# Patient Record
Sex: Female | Born: 1975 | Race: Black or African American | Hispanic: No | Marital: Married | Smoking: Never smoker
Health system: Southern US, Community
[De-identification: ages and names within clinical notes are randomized; demographics above are authoritative.]

## PROBLEM LIST (undated history)

## (undated) DIAGNOSIS — A749 Chlamydial infection, unspecified: Secondary | ICD-10-CM

## (undated) DIAGNOSIS — R51 Headache: Secondary | ICD-10-CM

## (undated) DIAGNOSIS — A64 Unspecified sexually transmitted disease: Secondary | ICD-10-CM

## (undated) HISTORY — PX: TONSILLECTOMY: SUR1361

## (undated) HISTORY — PX: BREAST BIOPSY: SHX20

## (undated) HISTORY — DX: Unspecified sexually transmitted disease: A64

## (undated) HISTORY — PX: OTHER SURGICAL HISTORY: SHX169

## (undated) HISTORY — PX: TUBAL LIGATION: SHX77

---

## 2001-05-30 ENCOUNTER — Encounter: Payer: Self-pay | Admitting: Family Medicine

## 2001-05-30 ENCOUNTER — Ambulatory Visit (HOSPITAL_COMMUNITY): Admission: RE | Admit: 2001-05-30 | Discharge: 2001-05-30 | Payer: Self-pay | Admitting: *Deleted

## 2001-05-31 ENCOUNTER — Encounter: Payer: Self-pay | Admitting: Family Medicine

## 2001-05-31 ENCOUNTER — Inpatient Hospital Stay (HOSPITAL_COMMUNITY): Admission: AD | Admit: 2001-05-31 | Discharge: 2001-05-31 | Payer: Self-pay | Admitting: Family Medicine

## 2002-01-02 ENCOUNTER — Other Ambulatory Visit: Admission: RE | Admit: 2002-01-02 | Discharge: 2002-01-02 | Payer: Self-pay | Admitting: Family Medicine

## 2002-07-02 ENCOUNTER — Ambulatory Visit (HOSPITAL_COMMUNITY): Admission: RE | Admit: 2002-07-02 | Discharge: 2002-07-02 | Payer: Self-pay | Admitting: Orthopaedic Surgery

## 2002-07-02 ENCOUNTER — Encounter: Payer: Self-pay | Admitting: Orthopaedic Surgery

## 2002-12-16 ENCOUNTER — Other Ambulatory Visit: Admission: RE | Admit: 2002-12-16 | Discharge: 2002-12-16 | Payer: Self-pay | Admitting: Family Medicine

## 2003-03-03 ENCOUNTER — Emergency Department (HOSPITAL_COMMUNITY): Admission: EM | Admit: 2003-03-03 | Discharge: 2003-03-04 | Payer: Self-pay | Admitting: Emergency Medicine

## 2003-05-21 ENCOUNTER — Encounter: Payer: Self-pay | Admitting: Family Medicine

## 2003-05-21 ENCOUNTER — Ambulatory Visit (HOSPITAL_COMMUNITY): Admission: RE | Admit: 2003-05-21 | Discharge: 2003-05-21 | Payer: Self-pay | Admitting: Family Medicine

## 2004-11-30 ENCOUNTER — Emergency Department (HOSPITAL_COMMUNITY): Admission: EM | Admit: 2004-11-30 | Discharge: 2004-11-30 | Payer: Self-pay | Admitting: Emergency Medicine

## 2004-12-01 ENCOUNTER — Ambulatory Visit (HOSPITAL_COMMUNITY): Admission: RE | Admit: 2004-12-01 | Discharge: 2004-12-01 | Payer: Self-pay | Admitting: Emergency Medicine

## 2005-01-22 ENCOUNTER — Emergency Department (HOSPITAL_COMMUNITY): Admission: EM | Admit: 2005-01-22 | Discharge: 2005-01-22 | Payer: Self-pay | Admitting: Emergency Medicine

## 2005-02-12 ENCOUNTER — Emergency Department (HOSPITAL_COMMUNITY): Admission: EM | Admit: 2005-02-12 | Discharge: 2005-02-12 | Payer: Self-pay | Admitting: Emergency Medicine

## 2005-02-18 ENCOUNTER — Emergency Department (HOSPITAL_COMMUNITY): Admission: EM | Admit: 2005-02-18 | Discharge: 2005-02-18 | Payer: Self-pay | Admitting: Emergency Medicine

## 2005-02-22 ENCOUNTER — Inpatient Hospital Stay (HOSPITAL_COMMUNITY): Admission: AD | Admit: 2005-02-22 | Discharge: 2005-02-22 | Payer: Self-pay | Admitting: Obstetrics & Gynecology

## 2005-03-07 ENCOUNTER — Inpatient Hospital Stay (HOSPITAL_COMMUNITY): Admission: AD | Admit: 2005-03-07 | Discharge: 2005-03-07 | Payer: Self-pay | Admitting: Obstetrics and Gynecology

## 2005-05-09 ENCOUNTER — Ambulatory Visit (HOSPITAL_COMMUNITY): Admission: RE | Admit: 2005-05-09 | Discharge: 2005-05-09 | Payer: Self-pay | Admitting: Obstetrics & Gynecology

## 2005-06-16 ENCOUNTER — Emergency Department (HOSPITAL_COMMUNITY): Admission: EM | Admit: 2005-06-16 | Discharge: 2005-06-16 | Payer: Self-pay | Admitting: Emergency Medicine

## 2005-08-24 ENCOUNTER — Inpatient Hospital Stay (HOSPITAL_COMMUNITY): Admission: AD | Admit: 2005-08-24 | Discharge: 2005-08-25 | Payer: Self-pay | Admitting: Obstetrics

## 2005-08-28 ENCOUNTER — Inpatient Hospital Stay (HOSPITAL_COMMUNITY): Admission: AD | Admit: 2005-08-28 | Discharge: 2005-08-28 | Payer: Self-pay | Admitting: Obstetrics & Gynecology

## 2005-09-01 ENCOUNTER — Inpatient Hospital Stay (HOSPITAL_COMMUNITY): Admission: AD | Admit: 2005-09-01 | Discharge: 2005-09-01 | Payer: Self-pay | Admitting: Obstetrics & Gynecology

## 2005-09-09 ENCOUNTER — Inpatient Hospital Stay (HOSPITAL_COMMUNITY): Admission: AD | Admit: 2005-09-09 | Discharge: 2005-09-10 | Payer: Self-pay | Admitting: Obstetrics

## 2005-09-10 ENCOUNTER — Inpatient Hospital Stay (HOSPITAL_COMMUNITY): Admission: AD | Admit: 2005-09-10 | Discharge: 2005-09-10 | Payer: Self-pay | Admitting: Obstetrics

## 2005-09-24 ENCOUNTER — Inpatient Hospital Stay (HOSPITAL_COMMUNITY): Admission: AD | Admit: 2005-09-24 | Discharge: 2005-09-25 | Payer: Self-pay | Admitting: Obstetrics

## 2005-10-04 ENCOUNTER — Inpatient Hospital Stay (HOSPITAL_COMMUNITY): Admission: AD | Admit: 2005-10-04 | Discharge: 2005-10-07 | Payer: Self-pay | Admitting: Obstetrics & Gynecology

## 2005-10-06 ENCOUNTER — Encounter (INDEPENDENT_AMBULATORY_CARE_PROVIDER_SITE_OTHER): Payer: Self-pay | Admitting: Specialist

## 2006-02-28 ENCOUNTER — Emergency Department (HOSPITAL_COMMUNITY): Admission: EM | Admit: 2006-02-28 | Discharge: 2006-02-28 | Payer: Self-pay | Admitting: Emergency Medicine

## 2006-09-12 ENCOUNTER — Emergency Department (HOSPITAL_COMMUNITY): Admission: EM | Admit: 2006-09-12 | Discharge: 2006-09-12 | Payer: Self-pay | Admitting: Emergency Medicine

## 2007-02-02 ENCOUNTER — Inpatient Hospital Stay (HOSPITAL_COMMUNITY): Admission: AD | Admit: 2007-02-02 | Discharge: 2007-02-02 | Payer: Self-pay | Admitting: Obstetrics & Gynecology

## 2008-01-29 ENCOUNTER — Emergency Department (HOSPITAL_COMMUNITY): Admission: EM | Admit: 2008-01-29 | Discharge: 2008-01-29 | Payer: Self-pay | Admitting: Family Medicine

## 2009-04-08 ENCOUNTER — Emergency Department (HOSPITAL_COMMUNITY): Admission: EM | Admit: 2009-04-08 | Discharge: 2009-04-08 | Payer: Self-pay | Admitting: Emergency Medicine

## 2009-05-05 ENCOUNTER — Emergency Department (HOSPITAL_COMMUNITY): Admission: EM | Admit: 2009-05-05 | Discharge: 2009-05-05 | Payer: Self-pay | Admitting: Emergency Medicine

## 2009-10-11 ENCOUNTER — Emergency Department (HOSPITAL_COMMUNITY): Admission: EM | Admit: 2009-10-11 | Discharge: 2009-10-11 | Payer: Self-pay | Admitting: Emergency Medicine

## 2009-10-15 ENCOUNTER — Emergency Department (HOSPITAL_COMMUNITY): Admission: EM | Admit: 2009-10-15 | Discharge: 2009-10-15 | Payer: Self-pay | Admitting: Family Medicine

## 2010-01-19 ENCOUNTER — Emergency Department (HOSPITAL_COMMUNITY): Admission: EM | Admit: 2010-01-19 | Discharge: 2010-01-19 | Payer: Self-pay | Admitting: Emergency Medicine

## 2010-01-27 ENCOUNTER — Emergency Department (HOSPITAL_COMMUNITY): Admission: EM | Admit: 2010-01-27 | Discharge: 2010-01-27 | Payer: Self-pay | Admitting: Emergency Medicine

## 2010-03-04 ENCOUNTER — Emergency Department (HOSPITAL_COMMUNITY): Admission: EM | Admit: 2010-03-04 | Discharge: 2010-03-04 | Payer: Self-pay | Admitting: Emergency Medicine

## 2010-03-17 ENCOUNTER — Emergency Department (HOSPITAL_COMMUNITY): Admission: EM | Admit: 2010-03-17 | Discharge: 2010-03-17 | Payer: Self-pay | Admitting: Family Medicine

## 2010-03-23 ENCOUNTER — Emergency Department (HOSPITAL_COMMUNITY): Admission: EM | Admit: 2010-03-23 | Discharge: 2010-03-23 | Payer: Self-pay | Admitting: Family Medicine

## 2010-07-04 ENCOUNTER — Emergency Department (HOSPITAL_COMMUNITY): Admission: EM | Admit: 2010-07-04 | Discharge: 2010-07-04 | Payer: Self-pay | Admitting: Family Medicine

## 2010-08-05 ENCOUNTER — Inpatient Hospital Stay (HOSPITAL_COMMUNITY)
Admission: AD | Admit: 2010-08-05 | Discharge: 2010-08-05 | Payer: Self-pay | Source: Home / Self Care | Attending: Obstetrics & Gynecology | Admitting: Obstetrics & Gynecology

## 2010-08-23 ENCOUNTER — Ambulatory Visit: Admit: 2010-08-23 | Payer: Self-pay | Admitting: Obstetrics and Gynecology

## 2010-09-13 ENCOUNTER — Emergency Department (HOSPITAL_COMMUNITY)
Admission: EM | Admit: 2010-09-13 | Discharge: 2010-09-13 | Payer: BC Managed Care – PPO | Source: Home / Self Care | Admitting: Family Medicine

## 2010-10-23 ENCOUNTER — Inpatient Hospital Stay (HOSPITAL_COMMUNITY)
Admission: AD | Admit: 2010-10-23 | Discharge: 2010-10-23 | Disposition: A | Discharge status: Home / Self Care | Payer: BC Managed Care – PPO | Source: Ambulatory Visit | Attending: Obstetrics & Gynecology | Admitting: Obstetrics & Gynecology

## 2010-10-23 DIAGNOSIS — B356 Tinea cruris: Secondary | ICD-10-CM | POA: Insufficient documentation

## 2010-10-27 ENCOUNTER — Emergency Department (HOSPITAL_COMMUNITY)
Admission: EM | Admit: 2010-10-27 | Discharge: 2010-10-27 | Disposition: A | Discharge status: Home / Self Care | Payer: BC Managed Care – PPO | Attending: Emergency Medicine | Admitting: Emergency Medicine

## 2010-10-27 DIAGNOSIS — L0231 Cutaneous abscess of buttock: Secondary | ICD-10-CM | POA: Insufficient documentation

## 2010-10-27 DIAGNOSIS — L03317 Cellulitis of buttock: Secondary | ICD-10-CM | POA: Insufficient documentation

## 2010-10-30 LAB — POCT PREGNANCY, URINE: Preg Test, Ur: NEGATIVE

## 2010-10-30 LAB — GC/CHLAMYDIA PROBE AMP, GENITAL
Chlamydia, DNA Probe: NEGATIVE
GC Probe Amp, Genital: NEGATIVE

## 2010-10-30 LAB — CBC
HCT: 37 % (ref 36.0–46.0)
WBC: 3.5 10*3/uL — ABNORMAL LOW (ref 4.0–10.5)

## 2010-10-30 LAB — WET PREP, GENITAL
Clue Cells Wet Prep HPF POC: NONE SEEN
Yeast Wet Prep HPF POC: NONE SEEN

## 2010-11-06 LAB — POCT I-STAT, CHEM 8
BUN: 14 mg/dL (ref 6–23)
HCT: 44 % (ref 36.0–46.0)

## 2010-11-06 LAB — DIFFERENTIAL
Basophils Absolute: 0 10*3/uL (ref 0.0–0.1)
Eosinophils Absolute: 0.2 10*3/uL (ref 0.0–0.7)
Lymphocytes Relative: 29 % (ref 12–46)
Monocytes Absolute: 0.5 10*3/uL (ref 0.1–1.0)
Monocytes Relative: 7 % (ref 3–12)
Neutro Abs: 4 10*3/uL (ref 1.7–7.7)
Neutrophils Relative %: 61 % (ref 43–77)

## 2010-11-06 LAB — CBC
MCHC: 34.7 g/dL (ref 30.0–36.0)
RDW: 12.8 % (ref 11.5–15.5)
WBC: 6.5 10*3/uL (ref 4.0–10.5)

## 2010-11-06 LAB — POCT PREGNANCY, URINE: Preg Test, Ur: NEGATIVE

## 2010-11-12 ENCOUNTER — Emergency Department (HOSPITAL_COMMUNITY)
Admission: EM | Admit: 2010-11-12 | Discharge: 2010-11-12 | Disposition: A | Discharge status: Home / Self Care | Payer: BC Managed Care – PPO | Attending: Emergency Medicine | Admitting: Emergency Medicine

## 2010-11-12 DIAGNOSIS — L0231 Cutaneous abscess of buttock: Secondary | ICD-10-CM | POA: Insufficient documentation

## 2010-11-12 DIAGNOSIS — L03317 Cellulitis of buttock: Secondary | ICD-10-CM | POA: Insufficient documentation

## 2010-11-12 DIAGNOSIS — R609 Edema, unspecified: Secondary | ICD-10-CM | POA: Insufficient documentation

## 2010-11-14 ENCOUNTER — Inpatient Hospital Stay (INDEPENDENT_AMBULATORY_CARE_PROVIDER_SITE_OTHER)
Admission: RE | Admit: 2010-11-14 | Discharge: 2010-11-14 | Disposition: A | Discharge status: Home / Self Care | Payer: BC Managed Care – PPO | Source: Ambulatory Visit | Attending: Emergency Medicine | Admitting: Emergency Medicine

## 2010-11-14 DIAGNOSIS — L0291 Cutaneous abscess, unspecified: Secondary | ICD-10-CM

## 2010-11-14 DIAGNOSIS — L0390 Cellulitis, unspecified: Secondary | ICD-10-CM

## 2010-12-15 ENCOUNTER — Emergency Department (HOSPITAL_COMMUNITY)
Admission: EM | Admit: 2010-12-15 | Discharge: 2010-12-15 | Disposition: A | Discharge status: Home / Self Care | Payer: BC Managed Care – PPO | Attending: Emergency Medicine | Admitting: Emergency Medicine

## 2010-12-15 DIAGNOSIS — L03317 Cellulitis of buttock: Secondary | ICD-10-CM | POA: Insufficient documentation

## 2010-12-15 DIAGNOSIS — L0231 Cutaneous abscess of buttock: Secondary | ICD-10-CM | POA: Insufficient documentation

## 2010-12-17 ENCOUNTER — Emergency Department (HOSPITAL_COMMUNITY)
Admission: EM | Admit: 2010-12-17 | Discharge: 2010-12-17 | Disposition: A | Discharge status: Home / Self Care | Payer: BC Managed Care – PPO | Attending: Emergency Medicine | Admitting: Emergency Medicine

## 2010-12-17 DIAGNOSIS — L0231 Cutaneous abscess of buttock: Secondary | ICD-10-CM | POA: Insufficient documentation

## 2010-12-17 DIAGNOSIS — L03317 Cellulitis of buttock: Secondary | ICD-10-CM | POA: Insufficient documentation

## 2010-12-17 LAB — CBC
HCT: 38.5 % (ref 36.0–46.0)
Hemoglobin: 12.9 g/dL (ref 12.0–15.0)
MCH: 29.6 pg (ref 26.0–34.0)
MCHC: 33.5 g/dL (ref 30.0–36.0)
MCV: 88.3 fL (ref 78.0–100.0)
RDW: 13.4 % (ref 11.5–15.5)
WBC: 6.2 10*3/uL (ref 4.0–10.5)

## 2010-12-17 LAB — DIFFERENTIAL
Basophils Absolute: 0 10*3/uL (ref 0.0–0.1)
Basophils Relative: 0 % (ref 0–1)
Lymphocytes Relative: 26 % (ref 12–46)
Monocytes Absolute: 0.5 10*3/uL (ref 0.1–1.0)
Neutrophils Relative %: 62 % (ref 43–77)

## 2010-12-17 LAB — POCT I-STAT, CHEM 8
HCT: 40 % (ref 36.0–46.0)
Hemoglobin: 13.6 g/dL (ref 12.0–15.0)
Potassium: 3.7 mEq/L (ref 3.5–5.1)
Sodium: 142 mEq/L (ref 135–145)
TCO2: 28 mmol/L (ref 0–100)

## 2011-01-05 NOTE — Discharge Summary (Signed)
Jasmine Gentry, Jasmine Gentry                ACCOUNT NO.:  [id number]   MEDICAL RECORD NO.:  [id number]          PATIENT TYPE:  INP   LOCATION:  9106                          [facility]  [facility]   PHYSICIAN:  Charles A. Clearance Coots, M.D.DATE OF BIRTH:  1976/03/30   DATE OF ADMISSION:  10/04/2005  DATE OF DISCHARGE:  10/07/2005                                 DISCHARGE SUMMARY   ADMITTING DIAGNOSES:  1.  Forty weeks gestation.  2.  Induction of labor for post dates.  3.  Desire sterilization.   DISCHARGE DIAGNOSES:  1.  Forty weeks gestation.  2.  Induction of labor for post dates.  3.  Desire sterilization status post normal spontaneous vaginal delivery of      a viable female infant on October 05, 2005 at 2136.  Apgars of 8 at one      minute and 9 at five minutes, weight of 4315 g and length of 51 cm.      Mother and infant discharged home in good condition.   REASON FOR ADMISSION:  A 35 year old black female, G4, P2, estimated date of  confinement of October 02, 2005 presented for induction of labor for post  dates.  Prenatal care was uncomplicated.  Group B strep was positive.   PAST MEDICAL HISTORY:  SURGERY:  Tonsillectomy and wisdom teeth.  ILLNESS:  None.   MEDICATIONS:  Prenatal vitamins.   ALLERGIES:  NO KNOWN DRUG ALLERGIES.   SOCIAL HISTORY:  Divorced, negative tobacco, alcohol or recreational drug  use.   PHYSICAL EXAMINATION:  VITAL SIGNS:  Afebrile and vital signs are stable.  GENERAL:  Well-nourished, well-developed female in no acute distress.  ABDOMEN:  Gravid, nontender.  CERVIX:  3 cm dilated at 50% effaced and vertex at a -3 station.   ADMITTING LABORATORY VALUES:  Hemoglobin 11, hematocrit 32.5, white blood  cell count 8,400 and platelets 295,000.   HOSPITAL COURSE:  The patient was started on Pitocin induction of labor and  made good progress in labor, and delivered a viable female infant without  complications.  The patient desired permanent sterilization and was  taken to  the operating room on postpartum day #1 for a tubal ligation procedure.  Postpartum tubal ligation was done without complications.  The remainder of  the postpartum and postoperative course was uncomplicated, and the patient  was discharged home on postpartum day #2 in good condition.   DISCHARGE LABORATORY VALUES:  Hemoglobin 9.9 and hematocrit 29.7.   DISCHARGE DISPOSITION:  MEDICATION:  1.  Darvocet N-100 and ibuprofen was prescribed for pain.  2.  Continue prenatal vitamins.   Routine written instructions were given for obstetrical discharge.  The  patient is to call the office for a follow-up appointment in two weeks.      Charles A. Clearance Coots, M.D.  Electronically Signed     CAH/MEDQ  D:  10/07/2005  T:  10/07/2005  Job:  [id number]

## 2011-01-05 NOTE — Op Note (Signed)
[name], Jasmine Gentry                ACCOUNT NO.:  [id number]   MEDICAL RECORD NO.:  [id number]          PATIENT TYPE:  INP   LOCATION:  9106                          [facility]   PHYSICIAN:  Charles A. Clearance Coots, M.D.DATE OF BIRTH:  05-08-1976   DATE OF PROCEDURE:  10/06/2005  DATE OF DISCHARGE:                                 OPERATIVE REPORT   PREOPERATIVE DIAGNOSIS:  Desires sterilization.   POSTOPERATIVE DIAGNOSIS:  Desires sterilization.   PROCEDURE:  Bilateral partial salpingectomy.   SURGEON:  Coral Ceo, MD   ANESTHESIA:  Epidural.   ESTIMATED BLOOD LOSS:  Negligible.   COMPLICATIONS:  None.   SPECIMEN:  Approximately 2 cm portions of left and right fallopian tubes.   OPERATION:  The patient was brought to the operating room and after  satisfactory redosing of the epidural, the abdomen was prepped and draped in  the usual sterile fashion.  A small inferior umbilical incision was made  with a scalpel that was deepened down to the fascia with curved Mayo  scissors.  The fascia was grasped in the midline with Kelly forceps and cut  transversely with curved Mayo scissors.  The fascial incision was extended  to the left and to the right with the curved Mayo scissors.  The peritoneum  was also incised with the curved Mayo scissors at the same time as the  fascial incision.  Right-angle retractors were placed in the incision, and  the right fallopian tube was identified and was grasped with a Babcock  clamp.  Tube was followed from the corneal end to the fimbrial end and then  grasped with Babcock clamps, and then the tube was followed retrograde back  to the isthmic area of the tube, and this area was grasped with the Babcock  clamp.  A knuckle of tube beneath the Babcock clamp was doubly ligated with  #1 plain catgut, and the section of tube above the knot was excised with  Metzenbaum scissors and submitted to pathology for evaluation.  There was no  active bleeding  from the tubal stumps.  The same procedure was performed on  the opposite side without complications.  The abdomen was then closed as  follows.  The peritoneum and fascia was closed as one with a continuous  suture of 2-0 Vicryl; the skin was closed with a continuous subcuticular  suture of 3-0 Monocryl.  Sterile bandage was applied to the incision  closure.  Surgical technician indicated that all needle, sponge, and  instrument counts were correct.  The patient tolerated the procedure well  and was transported to the recovery room in satisfactory condition.     Charles A. Clearance Coots, M.D.  Electronically Signed    CAH/MEDQ  D:  10/06/2005  T:  10/06/2005  Job:  [id number]

## 2011-01-07 ENCOUNTER — Emergency Department (HOSPITAL_COMMUNITY)
Admission: EM | Admit: 2011-01-07 | Discharge: 2011-01-07 | Disposition: A | Discharge status: Home / Self Care | Payer: BC Managed Care – PPO | Attending: Emergency Medicine | Admitting: Emergency Medicine

## 2011-01-07 DIAGNOSIS — R51 Headache: Secondary | ICD-10-CM | POA: Insufficient documentation

## 2011-01-07 DIAGNOSIS — H53149 Visual discomfort, unspecified: Secondary | ICD-10-CM | POA: Insufficient documentation

## 2011-01-07 DIAGNOSIS — R112 Nausea with vomiting, unspecified: Secondary | ICD-10-CM | POA: Insufficient documentation

## 2011-04-18 ENCOUNTER — Emergency Department (HOSPITAL_COMMUNITY)
Admission: EM | Admit: 2011-04-18 | Discharge: 2011-04-18 | Disposition: A | Discharge status: Home / Self Care | Payer: BC Managed Care – PPO | Attending: Emergency Medicine | Admitting: Emergency Medicine

## 2011-04-18 DIAGNOSIS — R110 Nausea: Secondary | ICD-10-CM | POA: Insufficient documentation

## 2011-04-18 DIAGNOSIS — R51 Headache: Secondary | ICD-10-CM | POA: Insufficient documentation

## 2011-04-21 ENCOUNTER — Emergency Department (HOSPITAL_COMMUNITY)
Admission: EM | Admit: 2011-04-21 | Discharge: 2011-04-21 | Disposition: A | Discharge status: Home / Self Care | Payer: BC Managed Care – PPO | Attending: Emergency Medicine | Admitting: Emergency Medicine

## 2011-04-21 DIAGNOSIS — R42 Dizziness and giddiness: Secondary | ICD-10-CM | POA: Insufficient documentation

## 2011-04-21 DIAGNOSIS — H53149 Visual discomfort, unspecified: Secondary | ICD-10-CM | POA: Insufficient documentation

## 2011-04-21 DIAGNOSIS — H538 Other visual disturbances: Secondary | ICD-10-CM | POA: Insufficient documentation

## 2011-04-21 DIAGNOSIS — G43909 Migraine, unspecified, not intractable, without status migrainosus: Secondary | ICD-10-CM | POA: Insufficient documentation

## 2011-04-21 DIAGNOSIS — R110 Nausea: Secondary | ICD-10-CM | POA: Insufficient documentation

## 2011-05-22 ENCOUNTER — Encounter (HOSPITAL_COMMUNITY): Payer: Self-pay | Admitting: *Deleted

## 2011-05-22 ENCOUNTER — Inpatient Hospital Stay (HOSPITAL_COMMUNITY): Payer: BC Managed Care – PPO

## 2011-05-22 ENCOUNTER — Inpatient Hospital Stay (HOSPITAL_COMMUNITY)
Admission: AD | Admit: 2011-05-22 | Discharge: 2011-05-22 | Disposition: A | Discharge status: Home / Self Care | Payer: BC Managed Care – PPO | Source: Ambulatory Visit | Attending: Obstetrics & Gynecology | Admitting: Obstetrics & Gynecology

## 2011-05-22 DIAGNOSIS — R109 Unspecified abdominal pain: Secondary | ICD-10-CM | POA: Insufficient documentation

## 2011-05-22 DIAGNOSIS — N949 Unspecified condition associated with female genital organs and menstrual cycle: Secondary | ICD-10-CM

## 2011-05-22 DIAGNOSIS — N938 Other specified abnormal uterine and vaginal bleeding: Secondary | ICD-10-CM

## 2011-05-22 HISTORY — DX: Chlamydial infection, unspecified: A74.9

## 2011-05-22 LAB — CBC
HCT: 37.7 % (ref 36.0–46.0)
MCHC: 33.7 g/dL (ref 30.0–36.0)
MCV: 88.7 fL (ref 78.0–100.0)
Platelets: 315 10*3/uL (ref 150–400)
RDW: 12.9 % (ref 11.5–15.5)
WBC: 5.9 10*3/uL (ref 4.0–10.5)

## 2011-05-22 LAB — URINE MICROSCOPIC-ADD ON

## 2011-05-22 LAB — URINALYSIS, ROUTINE W REFLEX MICROSCOPIC
Ketones, ur: NEGATIVE mg/dL
Protein, ur: NEGATIVE mg/dL
Urobilinogen, UA: 0.2 mg/dL (ref 0.0–1.0)

## 2011-05-22 LAB — WET PREP, GENITAL
Clue Cells Wet Prep HPF POC: NONE SEEN
Trich, Wet Prep: NONE SEEN

## 2011-05-22 MED ORDER — MEDROXYPROGESTERONE ACETATE 10 MG PO TABS: 10.0000 mg | ORAL_TABLET | Freq: Every day | ORAL | Status: DC

## 2011-05-22 MED ORDER — NAPROXEN SODIUM 550 MG PO TABS: 550.0000 mg | ORAL_TABLET | Freq: Two times a day (BID) | ORAL | Status: DC

## 2011-05-22 NOTE — ED Provider Notes (Signed)
History   Pt presents today c/o vag bleeding and lower abd cramping. She states she began her menses about 2wks ago and has continued to bleed every day since that time. She states the bleeding became very heavy last night and she had to change pads about every . She denies fever, vag irritation, or any other problems at this time. Her last episode of intercourse was about 3wks ago.  Chief Complaint  Patient presents with  . Vaginal Bleeding   HPI  OB History    Grav Para Term Preterm Abortions TAB SAB Ect Mult Living   4 3 3  0 1 0 1 0 0 3      Past Medical History  Diagnosis Date  . Chlamydia     Past Surgical History  Procedure Date  . Tubal ligation   . Tonsillectomy     No family history on file.  History  Substance Use Topics  . Smoking status: Never Smoker   . Smokeless tobacco: Never Used  . Alcohol Use: No    Allergies: Allergies not on file  No prescriptions prior to admission    Review of Systems  Constitutional: Negative for fever.  Cardiovascular: Negative for chest pain.  Gastrointestinal: Positive for abdominal pain. Negative for nausea, vomiting, diarrhea and constipation.  Genitourinary: Negative for dysuria, urgency, frequency and hematuria.  Neurological: Negative for dizziness and headaches.  Psychiatric/Behavioral: Negative for depression and suicidal ideas.   Physical Exam   Blood pressure 131/82, pulse 90, temperature 98.1 F (36.7 C), temperature source Oral, resp. rate 18, height 5' 7.5" (1.715 m), weight 205 lb (92.987 kg), last menstrual period 05/07/2011.  Physical Exam  Constitutional: She is oriented to person, place, and time. She appears well-developed and well-nourished. No distress.  HENT:  Head: Normocephalic and atraumatic.  Eyes: EOM are normal. Pupils are equal, round, and reactive to light.  GI: Soft. She exhibits no distension and no mass. There is tenderness. There is no rebound and no guarding.  Genitourinary:  There is bleeding around the vagina. No vaginal discharge found.       Cervix Lg/closed. Uterus NL size and shape. No adnexal masses. Pt slightly tender on exam.  Neurological: She is alert and oriented to person, place, and time.  Skin: Skin is warm and dry. She is not diaphoretic.  Psychiatric: She has a normal mood and affect. Her behavior is normal. Judgment and thought content normal.    MAU Course  Procedures  Wet prep and GC/Chlamydia cultures done.  Results for orders placed during the hospital encounter of 05/22/11 (from the past 24 hour(s))  URINALYSIS, ROUTINE W REFLEX MICROSCOPIC     Status: Abnormal   Collection Time   05/22/11  7:55 AM      Component Value Range   Color, Urine BROWN (*) YELLOW    Appearance CLOUDY (*) CLEAR    Specific Gravity, Urine >1.030 (*) 1.005 - 1.030    pH 5.0  5.0 - 8.0    Glucose, UA NEGATIVE  NEGATIVE (mg/dL)   Hgb urine dipstick LARGE (*) NEGATIVE    Bilirubin Urine NEGATIVE  NEGATIVE    Ketones, ur NEGATIVE  NEGATIVE (mg/dL)   Protein, ur NEGATIVE  NEGATIVE (mg/dL)   Urobilinogen, UA 0.2  0.0 - 1.0 (mg/dL)   Nitrite NEGATIVE  NEGATIVE    Leukocytes, UA TRACE (*) NEGATIVE   URINE MICROSCOPIC-ADD ON     Status: Normal   Collection Time   05/22/11  7:55 AM  Component Value Range   Squamous Epithelial / LPF RARE  RARE    WBC, UA 3-6  <3 (WBC/hpf)   RBC / HPF TOO NUMEROUS TO COUNT  <3 (RBC/hpf)   Bacteria, UA RARE  RARE   POCT PREGNANCY, URINE     Status: Normal   Collection Time   05/22/11  8:05 AM      Component Value Range   Preg Test, Ur NEGATIVE    WET PREP, GENITAL     Status: Abnormal   Collection Time   05/22/11  8:18 AM      Component Value Range   Yeast, Wet Prep NONE SEEN  NONE SEEN    Trich, Wet Prep NONE SEEN  NONE SEEN    Clue Cells, Wet Prep NONE SEEN  NONE SEEN    WBC, Wet Prep HPF POC FEW (*) NONE SEEN   CBC     Status: Normal   Collection Time   05/22/11  8:23 AM      Component Value Range   WBC 5.9  4.0 -  10.5 (K/uL)   RBC 4.25  3.87 - 5.11 (MIL/uL)   Hemoglobin 12.7  12.0 - 15.0 (g/dL)   HCT 21.3  08.6 - 57.8 (%)   MCV 88.7  78.0 - 100.0 (fL)   MCH 29.9  26.0 - 34.0 (pg)   MCHC 33.7  30.0 - 36.0 (g/dL)   RDW 46.9  62.9 - 52.8 (%)   Platelets 315  150 - 400 (K/uL)    US Transvaginal Non-ob  05/22/2011  *RADIOLOGY REPORT*  Clinical Data: Pain  TRANSABDOMINAL AND TRANSVAGINAL ULTRASOUND OF PELVIS Technique:  Both transabdominal and transvaginal ultrasound examinations of the pelvis were performed. Transabdominal technique was performed for global imaging of the pelvis including uterus, ovaries, adnexal regions, and pelvic cul-de-sac.  Comparison: August 05, 2010   It was necessary to proceed with endovaginal exam following the transabdominal exam to visualize the endometrium.  Findings: The uterus is normal in size, measuring 7.6 x 5.0 x 4.7 cm.  The myometrial echotexture is heterogeneous and the junctional zone is indistinct which suggests an element of adenomyosis. There are no focal fibroids.  The endometrial stripe is upper  limits of normal in width, measuring 13 mm.  Both ovaries have a normal size and appearance.  The right ovary measures 3.8 x 1.9 x 2.6 cm, and the left ovary measures 3.8 x 2.8 x 2.5 cm.  There are no adnexal masses or free pelvic fluid.  IMPRESSION: Probable adenomyosis with heterogeneous myometrial echotexture and indistinct junctional zone.  Original Report Authenticated By: Brandon Melnick, M.D.   US Pelvis Complete  05/22/2011  *RADIOLOGY REPORT*  Clinical Data: Pain  TRANSABDOMINAL AND TRANSVAGINAL ULTRASOUND OF PELVIS Technique:  Both transabdominal and transvaginal ultrasound examinations of the pelvis were performed. Transabdominal technique was performed for global imaging of the pelvis including uterus, ovaries, adnexal regions, and pelvic cul-de-sac.  Comparison: August 05, 2010   It was necessary to proceed with endovaginal exam following the transabdominal exam  to visualize the endometrium.  Findings: The uterus is normal in size, measuring 7.6 x 5.0 x 4.7 cm.  The myometrial echotexture is heterogeneous and the junctional zone is indistinct which suggests an element of adenomyosis. There are no focal fibroids.  The endometrial stripe is upper  limits of normal in width, measuring 13 mm.  Both ovaries have a normal size and appearance.  The right ovary measures 3.8 x 1.9 x 2.6 cm, and  the left ovary measures 3.8 x 2.8 x 2.5 cm.  There are no adnexal masses or free pelvic fluid.  IMPRESSION: Probable adenomyosis with heterogeneous myometrial echotexture and indistinct junctional zone.  Original Report Authenticated By: Brandon Melnick, M.D.    Assessment and Plan  Abnormal vag bleeding: discussed with pt at length. Will give Rx for provera and anaprox DS. She will f/u in the GYN clinic. Discussed diet, activity, risks, and precautions.  Clinton Gallant. Rice III, DrHSc, MPAS, PA-C  05/22/2011, 8:18 AM   Henrietta Hoover, PA 05/22/11 0945

## 2011-05-22 NOTE — ED Provider Notes (Signed)
Attestation of Attending Supervision of Advanced Practitioner: Evaluation and management procedures were performed by the PA/NP/CNM/OB Fellow under my supervision/collaboration. Chart reviewed and agree with management and plan.  ANYANWU,UGONNA A 05/22/2011 10:44 AM   

## 2011-05-22 NOTE — Progress Notes (Signed)
LMP 2 weeks ago, continued for 2 weeks, stopped for 2 days then started again. Last night was changing pads every 30 minutes, passing clots. Cramping. States has had abnormal bleeding in the past, but has never been evaluated. States has not had a PAP smear in at least 5 years.

## 2011-05-23 LAB — URINE CULTURE: Colony Count: NO GROWTH

## 2011-05-23 LAB — GC/CHLAMYDIA PROBE AMP, GENITAL: Chlamydia, DNA Probe: NEGATIVE

## 2011-06-06 ENCOUNTER — Inpatient Hospital Stay (INDEPENDENT_AMBULATORY_CARE_PROVIDER_SITE_OTHER)
Admission: RE | Admit: 2011-06-06 | Discharge: 2011-06-06 | Disposition: A | Discharge status: Home / Self Care | Payer: BC Managed Care – PPO | Source: Ambulatory Visit | Attending: Family Medicine | Admitting: Family Medicine

## 2011-06-06 DIAGNOSIS — M545 Low back pain, unspecified: Secondary | ICD-10-CM

## 2011-06-06 LAB — HCG, SERUM, QUALITATIVE: Preg, Serum: NEGATIVE

## 2011-06-16 ENCOUNTER — Emergency Department (HOSPITAL_COMMUNITY): Payer: BC Managed Care – PPO

## 2011-06-16 ENCOUNTER — Emergency Department (HOSPITAL_COMMUNITY)
Admission: EM | Admit: 2011-06-16 | Discharge: 2011-06-16 | Disposition: A | Discharge status: Home / Self Care | Payer: BC Managed Care – PPO | Attending: Emergency Medicine | Admitting: Emergency Medicine

## 2011-06-16 DIAGNOSIS — R05 Cough: Secondary | ICD-10-CM | POA: Insufficient documentation

## 2011-06-16 DIAGNOSIS — R0989 Other specified symptoms and signs involving the circulatory and respiratory systems: Secondary | ICD-10-CM | POA: Insufficient documentation

## 2011-06-16 DIAGNOSIS — R059 Cough, unspecified: Secondary | ICD-10-CM | POA: Insufficient documentation

## 2011-06-16 DIAGNOSIS — J069 Acute upper respiratory infection, unspecified: Secondary | ICD-10-CM | POA: Insufficient documentation

## 2011-10-14 IMAGING — CR DG SHOULDER 2+V*R*
3 series · 3 of 3 positions shown · non-contrast
Comparison: None

CLINICAL DATA: Hurt right shoulder playing softball, pain, unable
to raise arm

RIGHT SHOULDER - 2+ VIEW

[w shoulder ap internal righ]
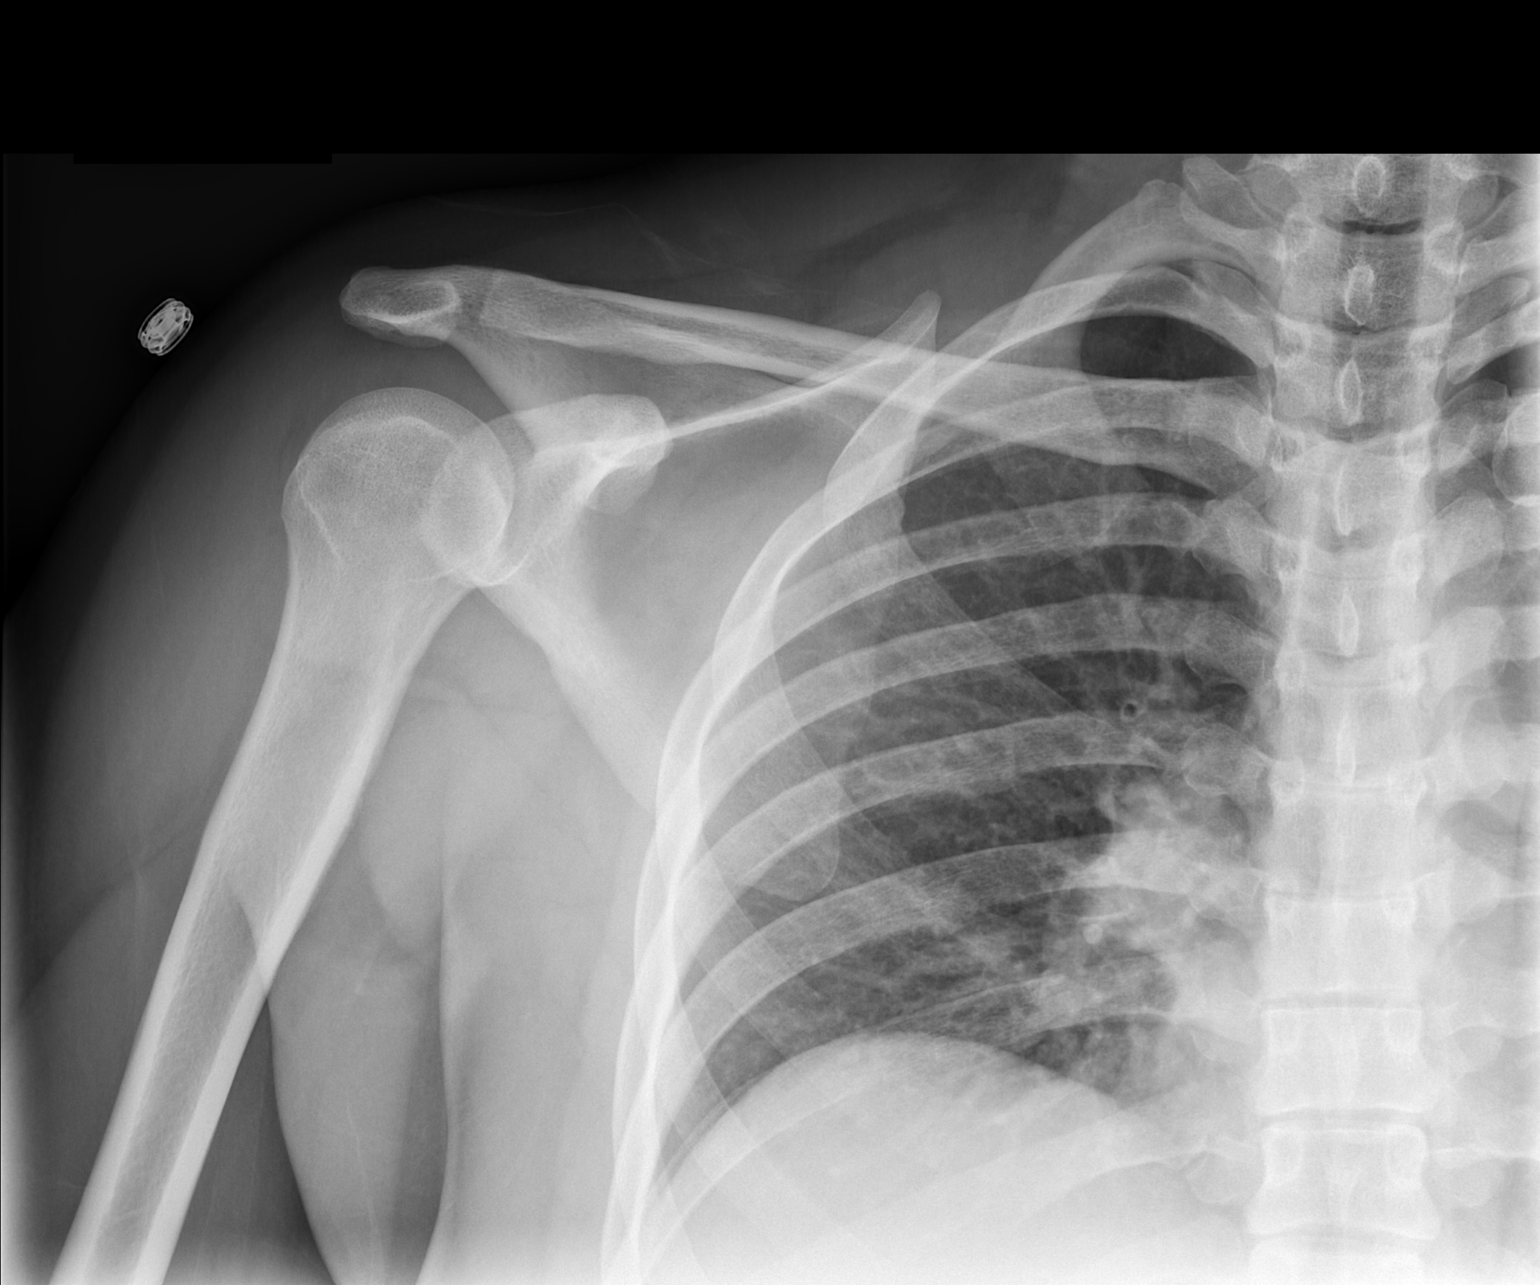

[w shoulder ap external righ]
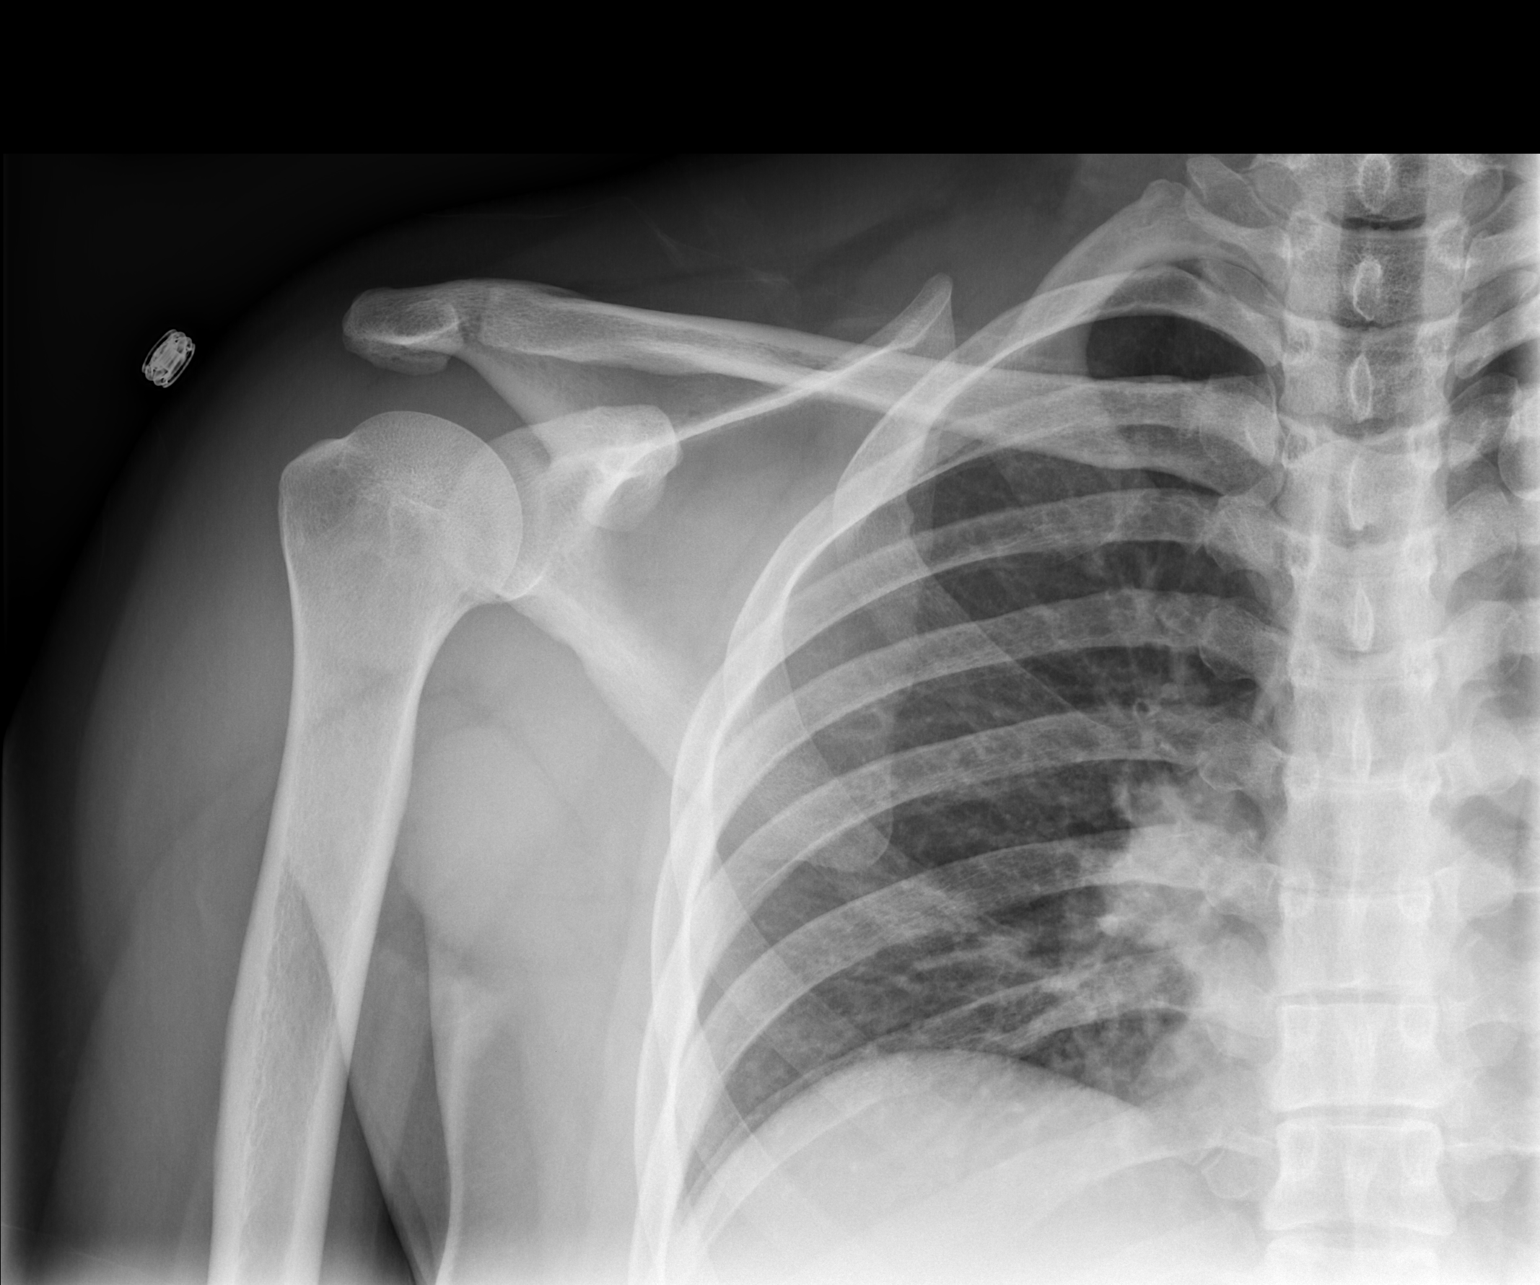

[w shoulder y view right]
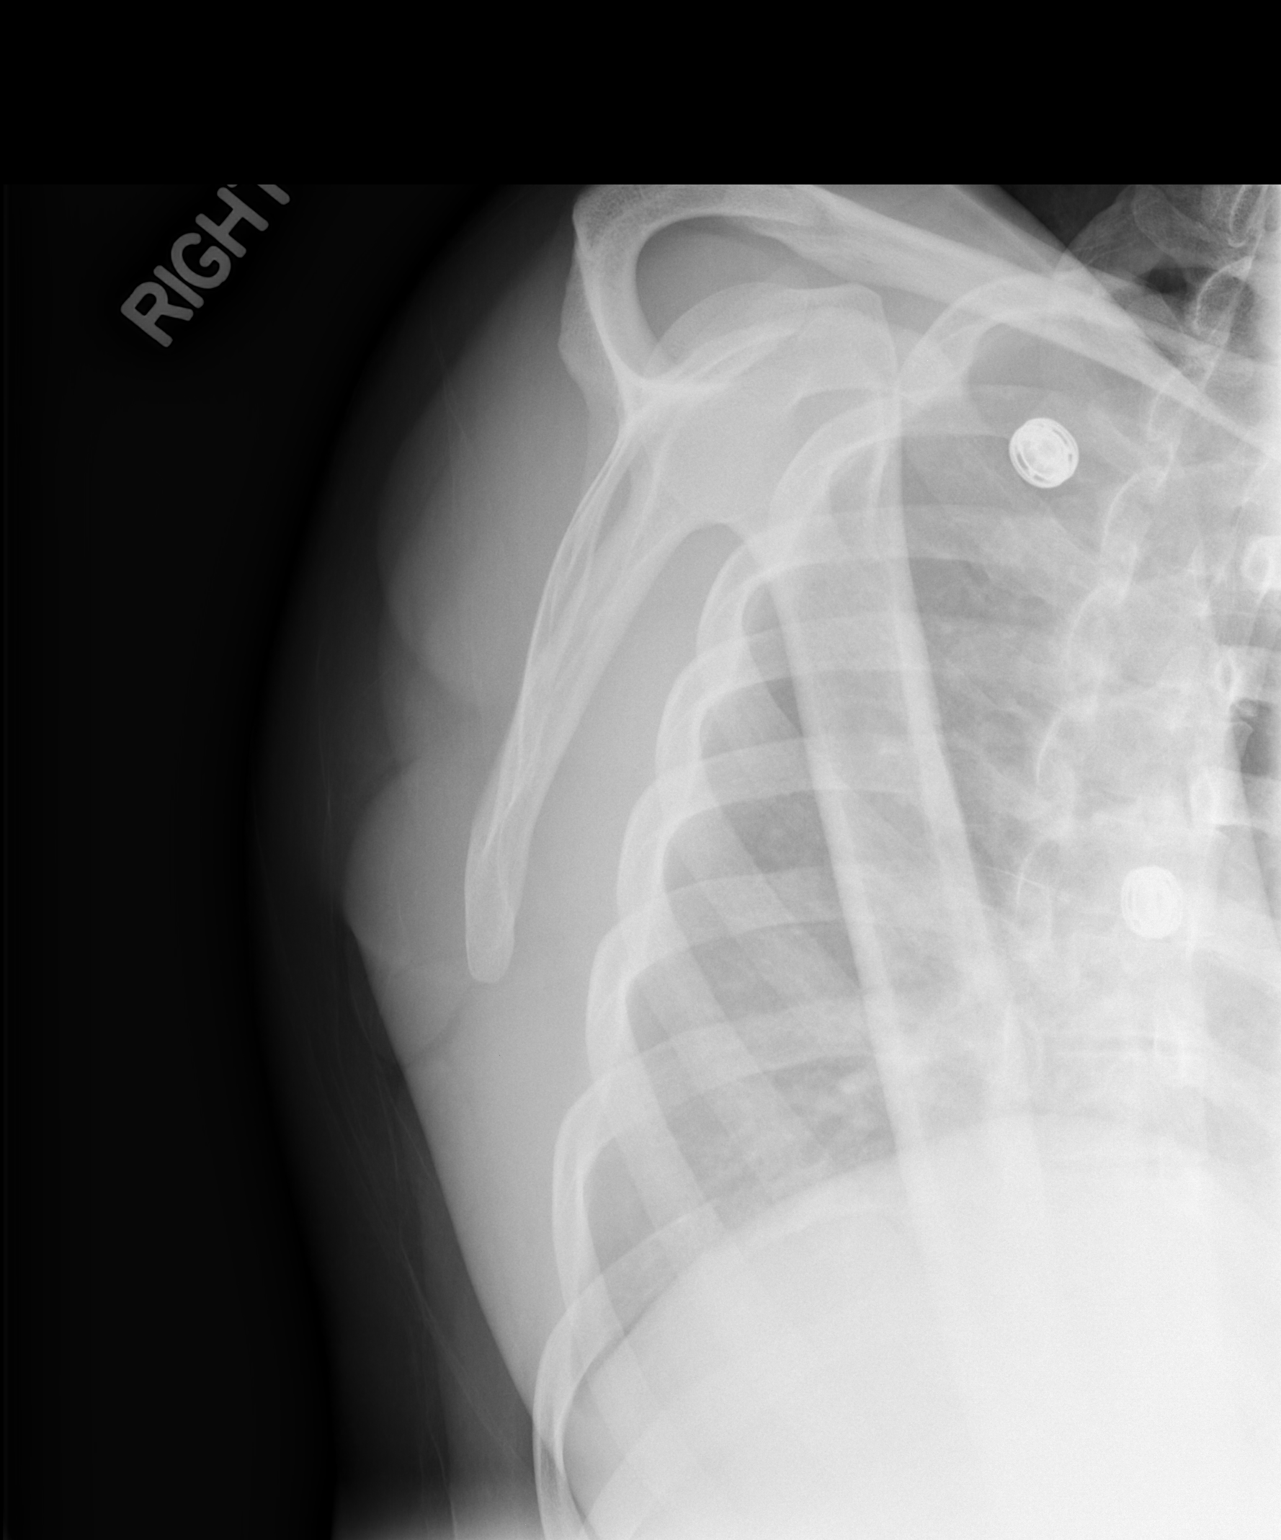

[3 of 3 positions shown; findings below may reference images not displayed]

FINDINGS: Bone mineralization normal.
AC joint alignment normal.
No acute fracture, dislocation or bone destruction.
Visualized right ribs unremarkable.
IMPRESSION: Normal exam.

## 2011-12-02 ENCOUNTER — Encounter (HOSPITAL_COMMUNITY): Payer: Self-pay | Admitting: *Deleted

## 2011-12-02 ENCOUNTER — Emergency Department (HOSPITAL_COMMUNITY)
Admission: EM | Admit: 2011-12-02 | Discharge: 2011-12-02 | Disposition: A | Discharge status: Home / Self Care | Payer: BC Managed Care – PPO | Attending: Emergency Medicine | Admitting: Emergency Medicine

## 2011-12-02 DIAGNOSIS — H53149 Visual discomfort, unspecified: Secondary | ICD-10-CM | POA: Insufficient documentation

## 2011-12-02 DIAGNOSIS — R51 Headache: Secondary | ICD-10-CM | POA: Insufficient documentation

## 2011-12-02 DIAGNOSIS — R110 Nausea: Secondary | ICD-10-CM | POA: Insufficient documentation

## 2011-12-02 HISTORY — DX: Headache: R51

## 2011-12-02 MED ORDER — DIPHENHYDRAMINE HCL 50 MG/ML IJ SOLN
25.0000 mg | Freq: Once | INTRAMUSCULAR | Status: AC
  Administered 2011-12-02: 25 mg via INTRAVENOUS
  Filled 2011-12-02: qty 1

## 2011-12-02 MED ORDER — SODIUM CHLORIDE 0.9 % IV BOLUS (SEPSIS)
1000.0000 mL | Freq: Once | INTRAVENOUS | Status: AC
  Administered 2011-12-02: 1000 mL via INTRAVENOUS

## 2011-12-02 MED ORDER — KETOROLAC TROMETHAMINE 30 MG/ML IJ SOLN
30.0000 mg | Freq: Once | INTRAMUSCULAR | Status: AC
  Administered 2011-12-02: 30 mg via INTRAVENOUS
  Filled 2011-12-02: qty 1

## 2011-12-02 MED ORDER — DEXAMETHASONE SODIUM PHOSPHATE 10 MG/ML IJ SOLN
10.0000 mg | Freq: Once | INTRAMUSCULAR | Status: AC
  Administered 2011-12-02: 10 mg via INTRAVENOUS
  Filled 2011-12-02: qty 1

## 2011-12-02 MED ORDER — METOCLOPRAMIDE HCL 5 MG/ML IJ SOLN
10.0000 mg | Freq: Once | INTRAMUSCULAR | Status: AC
  Administered 2011-12-02: 10 mg via INTRAVENOUS
  Filled 2011-12-02: qty 2

## 2011-12-02 NOTE — ED Provider Notes (Signed)
History   CSN: [id number]  Arrival date & time 12/02/11  1941  First MD Initiated Contact with Patient 12/02/11 2025      Chief Complaint  Patient presents with  . Headache    HPI  History provided by the patient. Patient is a 36 year old female with history of recurrent headaches who complains of similar type of headache that began 2 days ago. Patient reports typical "migraine" type headache across her floor head bilaterally it has gradually gotten worse since Friday. Patient states symptoms were the worst on Saturday. Patient attempted using ibuprofen and Excedrin a couple times throughout the day yesterday and this morning without significant improvement. Symptoms have been associated with some photophobia and nausea. Patient denies any episodes of vomiting. She denies fever, chills or sweats. Patient denies any neck pain or stiffness. Patient denies any vision changes, numbness, weakness or tingling in extremities. Symptoms are similar to previous migraines. Patient reports having similar type headaches once every 3-4 months.    Past Medical History  Diagnosis Date  . Chlamydia   . Headache     Past Surgical History  Procedure Date  . Tubal ligation   . Tonsillectomy     No family history on file.  History  Substance Use Topics  . Smoking status: Never Smoker   . Smokeless tobacco: Never Used  . Alcohol Use: No    OB History    Grav Para Term Preterm Abortions TAB SAB Ect Mult Living   4 3 3  0 1 0 1 0 0 3      Review of Systems  Constitutional: Positive for appetite change. Negative for chills.  HENT: Negative for neck stiffness.   Eyes: Positive for photophobia. Negative for pain.  Respiratory: Negative for cough and shortness of breath.   Cardiovascular: Negative for chest pain.  Gastrointestinal: Positive for nausea. Negative for vomiting and diarrhea.  Neurological: Positive for headaches. Negative for dizziness, weakness, light-headedness and numbness.     Allergies  Review of patient's allergies indicates no known allergies.  Home Medications   Current Outpatient Rx  Name Route Sig Dispense Refill  . NAPROXEN SODIUM 550 MG PO TABS Oral Take 1 tablet (550 mg total) by mouth 2 (two) times daily with a meal. 30 tablet 0    BP 136/76  Pulse 88  Temp(Src) 98.1 F (36.7 C) (Oral)  Resp 20  SpO2 97%  LMP 11/30/2011  Physical Exam  Nursing note and vitals reviewed. Constitutional: She is oriented to person, place, and time. She appears well-developed and well-nourished. No distress.  HENT:  Head: Normocephalic and atraumatic.  Eyes: Conjunctivae and EOM are normal. Pupils are equal, round, and reactive to light.       No nystagmus  Neck: Normal range of motion. Neck supple.       No meningeal sign  Cardiovascular: Normal rate and regular rhythm.   Pulmonary/Chest: Effort normal and breath sounds normal.  Abdominal: Soft.  Musculoskeletal: She exhibits no edema and no tenderness.  Neurological: She is alert and oriented to person, place, and time. She has normal strength. No cranial nerve deficit or sensory deficit. Gait normal.  Skin: Skin is warm and dry. No rash noted.  Psychiatric: She has a normal mood and affect. Her behavior is normal.    ED Course  Procedures      1. Headache       MDM  8:20 p.m. patient seen and evaluated. Patient no acute distress. Patient with typical headache symptoms. Pain  was gradual onset. Patient has normal nonfocal neuro exam. Will administer headache cocktail with Toradol, Reglan, Decadron and Benadryl IV.        Angus Seller, [place] 12/03/11 [phone]

## 2011-12-02 NOTE — Discharge Instructions (Signed)
You were seen and treated today for your headache symptoms. Your providers today for the ear examination was normal without concerning signs for an emergent cause of your headache. Please continue to rest and use over-the-counter headache medications to treat your symptoms. Please followup with a primary care provider for continued evaluation and treatment. If you develop any worsening symptoms, confusion, difficulty with speech, vision change, weakness, numbness or paralysis in your extremities please return to emergency room.   Headache, General, Unknown Cause The specific cause of your headache may not have been found today. There are many causes and types of headache. A few common ones are:  Tension headache.   Migraine.   Infections (examples: dental and sinus infections).   Bone and/or joint problems in the neck or jaw.   Depression.   Eye problems.  These headaches are not life threatening.  Headaches can sometimes be diagnosed by a patient history and a physical exam. Sometimes, lab and imaging studies (such as x-ray and/or CT scan) are used to rule out more serious problems. In some cases, a spinal tap (lumbar puncture) may be requested. There are many times when your exam and tests may be normal on the first visit even when there is a serious problem causing your headaches. Because of that, it is very important to follow up with your doctor or local clinic for further evaluation. FINDING OUT THE RESULTS OF TESTS  If a radiology test was performed, a radiologist will review your results.   You will be contacted by the emergency department or your physician if any test results require a change in your treatment plan.   Not all test results may be available during your visit. If your test results are not back during the visit, make an appointment with your caregiver to find out the results. Do not assume everything is normal if you have not heard from your caregiver or the medical  facility. It is important for you to follow up on all of your test results.  HOME CARE INSTRUCTIONS   Keep follow-up appointments with your caregiver, or any specialist referral.   Only take over-the-counter or prescription medicines for pain, discomfort, or fever as directed by your caregiver.   Biofeedback, massage, or other relaxation techniques may be helpful.   Ice packs or heat applied to the head and neck can be used. Do this three to four times per day, or as needed.   Call your doctor if you have any questions or concerns.   If you smoke, you should quit.  SEEK MEDICAL CARE IF:   You develop problems with medications prescribed.   You do not respond to or obtain relief from medications.   You have a change from the usual headache.   You develop nausea or vomiting.  SEEK IMMEDIATE MEDICAL CARE IF:   If your headache becomes severe.   You have an unexplained oral temperature above 102 F (38.9 C), or as your caregiver suggests.   You have a stiff neck.   You have loss of vision.   You have muscular weakness.   You have loss of muscular control.   You develop severe symptoms different from your first symptoms.   You start losing your balance or have trouble walking.   You feel faint or pass out.  MAKE SURE YOU:   Understand these instructions.   Will watch your condition.   Will get help right away if you are not doing well or get worse.  Document Released: 08/06/2005 Document Revised: 07/26/2011 Document Reviewed: 03/25/2008 [facility] Patient Information 2012 [place], [place].   RESOURCE GUIDE  Dental Problems  Patients with Medicaid: [facility] [id number]                                           [phone] Phone:  [phone]                                                  Phone:  [phone]  If unable to pay or uninsured, contact:  [facility] or [facility] to  become qualified for the adult dental clinic.  Chronic Pain Problems Contact [facility]  [phone] Patients need to be referred by their primary care doctor.  Insufficient Money for Medicine Contact United Way:  call "211" or [facility] 330-613-1272.  No Primary Care Doctor Call [facility]  732-762-1683 Other agencies that provide inexpensive medical care    [facility]  [phone]    [facility]  [phone]    [facility]  [phone]    [facility]  [phone]    [facility]  [phone]    [facility]  [phone]  Psychological Services [facility] Behavioral Health  [phone] [facility]  [phone] [facility] Mental Health   [phone] (emergency services [phone])  Substance Abuse Resources [facility]  504-016-4311 [facility] 949-713-2776 [facility] 772-490-7769 Floydene Flock [phone] [phone] Residential & Outpatient Substance Abuse Program  (408)630-6484  Abuse/Neglect [facility] Child Abuse Hotline 906 562 1518 [facility] Child Abuse Hotline 254-140-5751 (After Hours)  Emergency Shelter [facility] Ministries (626)617-5347  Maternity Homes Room at the [place] [facility] [place] 431 393 8228 [place] Services (445)[phone]  MRSA Hotline #:   551-314-7847    [facility] Resources  [facility]     [employer]                          [facility] Dept. [address] [place]                       [address]      [address]  [place]                                                Cristobal Goldmann Phone:  [phone]                                   Phone:  [phone]  Phone:  [phone]  [facility] Mental Health Phone:  [phone]  [facility] Child Abuse Hotline 775-508-5634 (403)[phone] (After Hours)

## 2011-12-02 NOTE — ED Notes (Signed)
The pt has had a headache since yesterday.  She has a history of the same.  She has been nauseated

## 2011-12-03 NOTE — ED Provider Notes (Signed)
Medical screening examination/treatment/procedure(s) were performed by non-physician practitioner and as supervising physician I was immediately available for consultation/collaboration.   Ferlando Lia E Britteny Fiebelkorn, MD 12/03/11 2355 

## 2012-01-03 ENCOUNTER — Emergency Department (INDEPENDENT_AMBULATORY_CARE_PROVIDER_SITE_OTHER)
Admission: EM | Admit: 2012-01-03 | Discharge: 2012-01-03 | Disposition: A | Discharge status: Home / Self Care | Payer: BC Managed Care – PPO | Source: Home / Self Care | Attending: Family Medicine | Admitting: Family Medicine

## 2012-01-03 ENCOUNTER — Encounter (HOSPITAL_COMMUNITY): Payer: Self-pay | Admitting: *Deleted

## 2012-01-03 DIAGNOSIS — G43809 Other migraine, not intractable, without status migrainosus: Secondary | ICD-10-CM

## 2012-01-03 MED ORDER — DIPHENHYDRAMINE HCL 50 MG/ML IJ SOLN
50.0000 mg | Freq: Once | INTRAMUSCULAR | Status: AC
  Administered 2012-01-03: 50 mg via INTRAMUSCULAR

## 2012-01-03 MED ORDER — DIPHENHYDRAMINE HCL 50 MG/ML IJ SOLN
INTRAMUSCULAR | Status: AC
  Filled 2012-01-03: qty 1

## 2012-01-03 MED ORDER — KETOROLAC TROMETHAMINE 30 MG/ML IJ SOLN
INTRAMUSCULAR | Status: AC
  Filled 2012-01-03: qty 1

## 2012-01-03 MED ORDER — KETOROLAC TROMETHAMINE 30 MG/ML IJ SOLN
30.0000 mg | Freq: Once | INTRAMUSCULAR | Status: AC
  Administered 2012-01-03: 30 mg via INTRAMUSCULAR

## 2012-01-03 MED ORDER — ONDANSETRON 4 MG PO TBDP
ORAL_TABLET | ORAL | Status: AC
  Filled 2012-01-03: qty 1

## 2012-01-03 MED ORDER — ONDANSETRON HCL 4 MG PO TABS: 4.0000 mg | ORAL_TABLET | Freq: Four times a day (QID) | ORAL | Status: AC

## 2012-01-03 MED ORDER — ONDANSETRON 4 MG PO TBDP
4.0000 mg | ORAL_TABLET | Freq: Once | ORAL | Status: AC
  Administered 2012-01-03: 4 mg via ORAL

## 2012-01-03 NOTE — ED Provider Notes (Signed)
History     CSN: [id number]  Arrival date & time 01/03/12  1633   First MD Initiated Contact with Patient 01/03/12 1639      Chief Complaint  Patient presents with  . Migraine    (Consider location/radiation/quality/duration/timing/severity/associated sxs/prior treatment) Patient is a 36 y.o. female presenting with migraine. The history is provided by the patient.  Migraine This is a chronic problem. The current episode started yesterday. The problem has not changed since onset.Associated symptoms include headaches. Pertinent negatives include no chest pain and no abdominal pain. Associated symptoms comments: Menstrually related headaches, no aura just start on first day of cycle, no fever, s/p tubal..    Past Medical History  Diagnosis Date  . Chlamydia   . Headache     Past Surgical History  Procedure Date  . Tubal ligation   . Tonsillectomy     No family history on file.  History  Substance Use Topics  . Smoking status: Never Smoker   . Smokeless tobacco: Never Used  . Alcohol Use: No    OB History    Grav Para Term Preterm Abortions TAB SAB Ect Mult Living   4 3 3  0 1 0 1 0 0 3      Review of Systems  Constitutional: Positive for appetite change and fatigue. Negative for fever and chills.  Respiratory: Negative.   Cardiovascular: Negative for chest pain.  Gastrointestinal: Positive for nausea and vomiting. Negative for abdominal pain and diarrhea.  Genitourinary: Negative.   Musculoskeletal: Negative.   Skin: Negative.   Neurological: Positive for headaches. Negative for dizziness, weakness and numbness.  Psychiatric/Behavioral: Negative.     Allergies  Review of patient's allergies indicates no known allergies.  Home Medications   Current Outpatient Rx  Name Route Sig Dispense Refill  . NAPROXEN SODIUM 550 MG PO TABS Oral Take 1 tablet (550 mg total) by mouth 2 (two) times daily with a meal. 30 tablet 0  . ONDANSETRON HCL 4 MG PO TABS Oral Take  1 tablet (4 mg total) by mouth every 6 (six) hours. For n/v 10 tablet 0    BP 129/72  Pulse 92  Temp(Src) 98.5 F (36.9 C) (Oral)  Resp 20  SpO2 99%  LMP 12/06/2011  Physical Exam  Nursing note and vitals reviewed. Constitutional: She is oriented to person, place, and time. She appears well-developed and well-nourished. She appears distressed.  HENT:  Head: Normocephalic.  Right Ear: External ear normal.  Left Ear: External ear normal.  Nose: Nose normal.  Mouth/Throat: Oropharynx is clear and moist.  Eyes: Conjunctivae and EOM are normal. Pupils are equal, round, and reactive to light.  Neck: Normal range of motion. Neck supple.  Cardiovascular: Regular rhythm.   Lymphadenopathy:    She has no cervical adenopathy.  Neurological: She is alert and oriented to person, place, and time.  Skin: Skin is warm and dry.  Psychiatric: She has a normal mood and affect.    ED Course  Procedures (including critical care time)  Labs Reviewed - No data to display No results found.   1. Migraine variant       MDM          Linna Hoff, MD 01/03/12 1735

## 2012-01-03 NOTE — ED Notes (Signed)
Pt is here with complaints of migraine with onset yesterday associated with nausea, blurry vision and light sensitivity.  Pt has history of migraines, no PCP.

## 2012-01-03 NOTE — Discharge Instructions (Signed)
See your doctor as discussed. °

## 2012-01-05 ENCOUNTER — Encounter (HOSPITAL_COMMUNITY): Payer: Self-pay | Admitting: *Deleted

## 2012-01-05 ENCOUNTER — Emergency Department (HOSPITAL_COMMUNITY)
Admission: EM | Admit: 2012-01-05 | Discharge: 2012-01-05 | Disposition: A | Discharge status: Home / Self Care | Payer: BC Managed Care – PPO | Attending: Emergency Medicine | Admitting: Emergency Medicine

## 2012-01-05 DIAGNOSIS — G43909 Migraine, unspecified, not intractable, without status migrainosus: Secondary | ICD-10-CM | POA: Insufficient documentation

## 2012-01-05 LAB — ACETAMINOPHEN LEVEL: Acetaminophen (Tylenol), Serum: <15 ug/mL (ref 10–30)

## 2012-01-05 MED ORDER — SUMATRIPTAN SUCCINATE 50 MG PO TABS: 50.0000 mg | ORAL_TABLET | ORAL | Status: DC | PRN

## 2012-01-05 MED ORDER — KETOROLAC TROMETHAMINE 30 MG/ML IJ SOLN
30.0000 mg | Freq: Once | INTRAMUSCULAR | Status: AC
  Administered 2012-01-05: 30 mg via INTRAVENOUS
  Filled 2012-01-05: qty 1

## 2012-01-05 MED ORDER — DIPHENHYDRAMINE HCL 50 MG/ML IJ SOLN
25.0000 mg | Freq: Once | INTRAMUSCULAR | Status: AC
  Administered 2012-01-05: 25 mg via INTRAVENOUS
  Filled 2012-01-05: qty 1

## 2012-01-05 MED ORDER — METOCLOPRAMIDE HCL 5 MG/ML IJ SOLN
10.0000 mg | Freq: Once | INTRAMUSCULAR | Status: AC
  Administered 2012-01-05: 10 mg via INTRAVENOUS
  Filled 2012-01-05: qty 2

## 2012-01-05 MED ORDER — SODIUM CHLORIDE 0.9 % IV SOLN
INTRAVENOUS | Status: DC
  Administered 2012-01-05: 20:00:00 via INTRAVENOUS

## 2012-01-05 NOTE — ED Notes (Signed)
Pt from home with reports of migraine as well as photophobia that started on Wednesday. Pt reports going to Urgent Care on Wednesday with pain shot given as well as Rx for nausea meds but pain returned this morning.

## 2012-01-05 NOTE — ED Notes (Signed)
Pt reports migraines for 2-3 years that are becoming more frequent and do not respond all the time to OTC medications. Pt saw urgent care on Tuesday and was given a "shot of I don't know what" and then a prescription nausea medication which has relieved nausea. Pt currently has photophobia and frontal, throbbing headache 9/10, similar to other headaches in past. No other neuro symptoms. Unable to see headache clinic to which she was referred in the past.

## 2012-01-05 NOTE — ED Provider Notes (Signed)
History     CSN: [id number]  Arrival date & time 01/05/12  6962   First MD Initiated Contact with Patient 01/05/12 1931      Chief Complaint  Patient presents with  . Migraine  . Photophobia    HPI The patient presents emergency room with complaints of a migraine headache. The symptoms started last Tuesday. She was seen in the urgent care and treated. Her symptoms had resolved but started again this morning. The pain is located in the frontal part of her head. It's throbbing in nature and severe. This feels similar to her prior migraine headaches.  She has had some nausea but was able to take anti-nausea medications at home. She has not had any fever or, neck pain vomiting or diarrhea.  Past Medical History  Diagnosis Date  . Chlamydia   . Headache     Past Surgical History  Procedure Date  . Tubal ligation   . Tonsillectomy     History reviewed. No pertinent family history.  History  Substance Use Topics  . Smoking status: Never Smoker   . Smokeless tobacco: Never Used  . Alcohol Use: No    OB History    Grav Para Term Preterm Abortions TAB SAB Ect Mult Living   4 3 3  0 1 0 1 0 0 3      Review of Systems  All other systems reviewed and are negative.    Allergies  Review of patient's allergies indicates no known allergies.  Home Medications   Current Outpatient Rx  Name Route Sig Dispense Refill  . ONDANSETRON HCL 4 MG PO TABS Oral Take 1 tablet (4 mg total) by mouth every 6 (six) hours. For n/v 10 tablet 0    BP 130/79  Pulse 92  Temp(Src) 98.5 F (36.9 C) (Oral)  Resp 18  Ht 5\' 8"  (1.727 m)  Wt 190 lb (86.183 kg)  BMI 28.89 kg/m2  SpO2 95%  LMP 01/05/2012  Physical Exam  Nursing note and vitals reviewed. Constitutional: She is oriented to person, place, and time. She appears well-developed and well-nourished. No distress.  HENT:  Head: Normocephalic and atraumatic.  Right Ear: External ear normal.  Left Ear: External ear normal.    Mouth/Throat: Oropharynx is clear and moist.  Eyes: Conjunctivae are normal. Right eye exhibits no discharge. Left eye exhibits no discharge. No scleral icterus.  Neck: Normal range of motion. Neck supple. No tracheal deviation present.  Cardiovascular: Normal rate, regular rhythm and intact distal pulses.   Pulmonary/Chest: Effort normal and breath sounds normal. No stridor. No respiratory distress. She has no wheezes. She has no rales.  Abdominal: Soft. Bowel sounds are normal. She exhibits no distension. There is no tenderness. There is no rebound and no guarding.  Musculoskeletal: She exhibits no edema and no tenderness.  Neurological: She is alert and oriented to person, place, and time. She has normal strength. No cranial nerve deficit ( no gross defecits noted) or sensory deficit. She exhibits normal muscle tone. She displays no seizure activity. Coordination normal.       No pronator drift bilateral upper extrem, able to hold both legs off bed for 5 seconds, sensation intact in all extremities, no visual field cuts, no left or right sided neglect  Skin: Skin is warm and dry. No rash noted.  Psychiatric: She has a normal mood and affect.    ED Course  Procedures (including critical care time)  Labs Reviewed - No data to display No results  found.   1. Migraine headache       MDM  Pt felt better after treatment.  Will dc home with medications, imitrex.  Symptoms consistent with a recurrent migraine headache.        Celene Kras, MD 01/06/12 [phone]

## 2012-01-05 NOTE — ED Notes (Signed)
MD at bedside. 

## 2012-01-05 NOTE — Discharge Instructions (Signed)
Migraine Headache  A migraine is very bad pain on one or both sides of your head. The cause of a migraine is not always known. A migraine can be triggered or caused by different things, such as:   Alcohol.   Smoking.   Stress.   Periods (menstruation) in women.   Aged cheeses.   Foods or drinks that contain nitrates, glutamate, aspartame, or tyramine.   Lack of sleep.   Chocolate.   Caffeine.   Hunger.   Medicines, such as nitroglycerine (used to treat chest pain), birth control pills, estrogen, and some blood pressure medicines.  HOME CARE   Many medicines can help migraine pain or keep migraines from coming back. Your doctor can help you decide on a medicine or treatment program.   If you or your child gets a migraine, it may help to lie down in a dark, quiet room.   Keep a headache journal. This may help find out what is causing the headaches. For example, write down:   What you eat and drink.   How much sleep you get.   Any change to your diet or medicines.  GET HELP RIGHT AWAY IF:    The medicine does not work.   The pain begins again.   The neck is stiff.   You have trouble seeing.   The muscles are weak or you lose muscle control.   You have new symptoms.   You lose your balance.   You have trouble walking.   You feel faint or pass out.  MAKE SURE YOU:    Understand these instructions.   Will watch this condition.   Will get help right away if you are not doing well or get worse.  Document Released: 05/15/2008 Document Revised: 07/26/2011 Document Reviewed: 04/11/2009  [employer] Patient Information 2012 [employer].

## 2012-07-01 ENCOUNTER — Telehealth (HOSPITAL_COMMUNITY): Payer: Self-pay | Admitting: *Deleted

## 2012-07-01 NOTE — ED Notes (Signed)
Pt. called on VM and asked when her visit was in January for her wrist.   She needs it for WC.  I told her she did not have any visits here in January.  I checked further and found one we did 09/13/2010.  She asked if she made a co-pay. I told her I could not tell from here.  I suggested she call the business office. Vassie Moselle 07/01/2012

## 2012-11-24 ENCOUNTER — Inpatient Hospital Stay (HOSPITAL_COMMUNITY): Payer: BC Managed Care – PPO

## 2012-11-24 ENCOUNTER — Encounter (HOSPITAL_COMMUNITY): Payer: Self-pay

## 2012-11-24 ENCOUNTER — Inpatient Hospital Stay (HOSPITAL_COMMUNITY)
Admission: AD | Admit: 2012-11-24 | Discharge: 2012-11-24 | Disposition: A | Discharge status: Home / Self Care | Payer: BC Managed Care – PPO | Source: Ambulatory Visit | Attending: Obstetrics & Gynecology | Admitting: Obstetrics & Gynecology

## 2012-11-24 DIAGNOSIS — N949 Unspecified condition associated with female genital organs and menstrual cycle: Secondary | ICD-10-CM

## 2012-11-24 DIAGNOSIS — N925 Other specified irregular menstruation: Secondary | ICD-10-CM

## 2012-11-24 DIAGNOSIS — N760 Acute vaginitis: Secondary | ICD-10-CM | POA: Insufficient documentation

## 2012-11-24 DIAGNOSIS — N938 Other specified abnormal uterine and vaginal bleeding: Secondary | ICD-10-CM | POA: Insufficient documentation

## 2012-11-24 DIAGNOSIS — B9689 Other specified bacterial agents as the cause of diseases classified elsewhere: Secondary | ICD-10-CM | POA: Insufficient documentation

## 2012-11-24 DIAGNOSIS — A499 Bacterial infection, unspecified: Secondary | ICD-10-CM | POA: Insufficient documentation

## 2012-11-24 LAB — CBC
HCT: 41.6 % (ref 36.0–46.0)
Hemoglobin: 13.8 g/dL (ref 12.0–15.0)
MCH: 29.8 pg (ref 26.0–34.0)
MCHC: 33.2 g/dL (ref 30.0–36.0)
MCV: 89.8 fL (ref 78.0–100.0)
RDW: 13.5 % (ref 11.5–15.5)

## 2012-11-24 LAB — WET PREP, GENITAL: Trich, Wet Prep: NONE SEEN

## 2012-11-24 LAB — URINALYSIS, ROUTINE W REFLEX MICROSCOPIC
Bilirubin Urine: NEGATIVE
Glucose, UA: NEGATIVE mg/dL
Ketones, ur: NEGATIVE mg/dL
Protein, ur: NEGATIVE mg/dL
pH: 6.5 (ref 5.0–8.0)

## 2012-11-24 LAB — URINE MICROSCOPIC-ADD ON

## 2012-11-24 LAB — POCT PREGNANCY, URINE: Preg Test, Ur: NEGATIVE

## 2012-11-24 MED ORDER — METRONIDAZOLE 500 MG PO TABS: 500.0000 mg | ORAL_TABLET | Freq: Two times a day (BID) | ORAL | Status: DC

## 2012-11-24 NOTE — MAU Provider Note (Signed)
Attestation of Attending Supervision of Advanced Practitioner (CNM/NP): Evaluation and management procedures were performed by the Advanced Practitioner under my supervision and collaboration.  I have reviewed the Advanced Practitioner's note and chart, and I agree with the management and plan.  HARRAWAY-SMITH, Raequon Catanzaro 7:13 PM     

## 2012-11-24 NOTE — MAU Note (Signed)
Patient states that in March she had bleeding twice for 1 1/2 weeks at a time. Had bleeding last week and was very heavy, yesterday was heavy but lighter today. Slight cramping.

## 2012-11-24 NOTE — MAU Provider Note (Signed)
History     CSN: [id number]  Arrival date and time: 11/24/12 1158   None     Chief Complaint  Patient presents with  . Vaginal Bleeding   HPI 37 y.o. [id number] with abnormal vaginal bleeding. Reports 2 episodes of bleeding last month, each heavy and lasting about 1.5 weeks, then bleeding for the last week, heavy yesterday, light today. No pain, but does report some pain with periods. Periods have been irregular for a few years. Was seen in [facility]U with similar complaints of abnormal bleeding 05/2011, u/s at that time showed possible adenomyosis. Pt was supposed to follow up in GYN clinic, but states she did not follow up because "things got better". No pap "in a while", no history of abnl pap.   Past Medical History  Diagnosis Date  . Chlamydia   . Headache     Past Surgical History  Procedure Laterality Date  . Tubal ligation    . Tonsillectomy      Family History  Problem Relation Age of Onset  . Asthma Mother   . Hypertension Mother   . Diabetes Maternal Aunt   . Hypertension Maternal Grandmother     History  Substance Use Topics  . Smoking status: Never Smoker   . Smokeless tobacco: Never Used  . Alcohol Use: No    Allergies: No Known Allergies  No prescriptions prior to admission    Review of Systems  Constitutional: Negative.   Respiratory: Negative.   Cardiovascular: Negative.   Gastrointestinal: Negative for nausea, vomiting, abdominal pain, diarrhea and constipation.  Genitourinary: Negative for dysuria, urgency, frequency, hematuria and flank pain.       Positive for vaginal bleeding, postcoital bleeding, negative dyspareunia   Musculoskeletal: Negative.   Neurological: Negative.   Psychiatric/Behavioral: Negative.    Physical Exam   Blood pressure 136/74, pulse 97, temperature 97.8 F (36.6 C), temperature source Oral, resp. rate 16, height 5\' 8"  (1.727 m), weight 207 lb 6.4 oz (94.076 kg), last menstrual period 11/21/2012, SpO2  100.00%.  Physical Exam  Nursing note and vitals reviewed. Constitutional: She is oriented to person, place, and time. She appears well-developed and well-nourished. No distress.  Cardiovascular: Normal rate.   Respiratory: Effort normal.  GI: Soft. There is no tenderness.  Genitourinary: There is no tenderness or lesion on the right labia. There is no tenderness or lesion on the left labia. Uterus is enlarged. Uterus is not deviated, not fixed and not tender. Cervix exhibits friability (cervix extremely friable). Cervix exhibits no motion tenderness. Right adnexum displays no mass, no tenderness and no fullness. Left adnexum displays no mass, no tenderness and no fullness. There is bleeding (small) around the vagina. No erythema around the vagina.  Cervix enlarged (6-7 cm) and firm with nodularity of posterior left cervix  Musculoskeletal: Normal range of motion.  Neurological: She is alert and oriented to person, place, and time.  Skin: Skin is warm and dry.  Psychiatric: She has a normal mood and affect.    MAU Course  Procedures Results for orders placed during the hospital encounter of 11/24/12 (from the past 24 hour(s))  URINALYSIS, ROUTINE W REFLEX MICROSCOPIC     Status: Abnormal   Collection Time    11/24/12 12:10 PM      Result Value Range   Color, Urine YELLOW  YELLOW   APPearance HAZY (*) CLEAR   Specific Gravity, Urine 1.015  1.005 - 1.030   pH 6.5  5.0 - 8.0   Glucose, UA NEGATIVE  NEGATIVE mg/dL   Hgb urine dipstick LARGE (*) NEGATIVE   Bilirubin Urine NEGATIVE  NEGATIVE   Ketones, ur NEGATIVE  NEGATIVE mg/dL   Protein, ur NEGATIVE  NEGATIVE mg/dL   Urobilinogen, UA 1.0  0.0 - 1.0 mg/dL   Nitrite NEGATIVE  NEGATIVE   Leukocytes, UA SMALL (*) NEGATIVE  URINE MICROSCOPIC-ADD ON     Status: Abnormal   Collection Time    11/24/12 12:10 PM      Result Value Range   Squamous Epithelial / LPF FEW (*) RARE   WBC, UA 0-2  <3 WBC/hpf   Bacteria, UA FEW (*) RARE  CBC      Status: None   Collection Time    11/24/12 12:50 PM      Result Value Range   WBC 4.7  4.0 - 10.5 K/uL   RBC 4.63  3.87 - 5.11 MIL/uL   Hemoglobin 13.8  12.0 - 15.0 g/dL   HCT 16.1  09.6 - 04.5 %   MCV 89.8  78.0 - 100.0 fL   MCH 29.8  26.0 - 34.0 pg   MCHC 33.2  30.0 - 36.0 g/dL   RDW 40.9  81.1 - 91.4 %   Platelets 285  150 - 400 K/uL  POCT PREGNANCY, URINE     Status: None   Collection Time    11/24/12 12:58 PM      Result Value Range   Preg Test, Ur NEGATIVE  NEGATIVE  WET PREP, GENITAL     Status: Abnormal   Collection Time    11/24/12  1:13 PM      Result Value Range   Yeast Wet Prep HPF POC NONE SEEN  NONE SEEN   Trich, Wet Prep NONE SEEN  NONE SEEN   Clue Cells Wet Prep HPF POC FEW (*) NONE SEEN   WBC, Wet Prep HPF POC FEW (*) NONE SEEN   US Transvaginal Non-ob  11/24/2012  *RADIOLOGY REPORT*  Clinical Data: Irregular menses.  Prior tubal ligation.  TRANSABDOMINAL AND TRANSVAGINAL ULTRASOUND OF PELVIS  Technique:  Both transabdominal and transvaginal ultrasound examinations of the pelvis were performed.  Transabdominal technique was performed for global imaging of the pelvis including uterus, ovaries, adnexal regions, and pelvic cul-de-sac.  It was necessary to proceed with endovaginal exam following the transabdominal exam to visualize the endometrium and ovaries.  Comparison:  05/22/2011  Findings: Uterus:  Anteverted, retroflexed.  No focal abnormality.  Endometrium: 1.3 cm.  Trilaminar in appearance with trace endometrial fluid (assuming negative pregnancy test to exclude a gestational sac).  Right ovary: 3.0 x 2.2 x 2.0 cm.  Normal.  Left ovary: 4.7 x 3.2 x 2.4 cm.  Normal.  Other Findings:  Small free fluid noted in the cul-de-sac.  IMPRESSION: Normal exam.  Trilaminar appearance to the endometrium could obscure a subtle finding, and if irregular bleeding persists, consider follow-up pelvic ultrasound in 4-6 weeks during week immediately following the patient's menses.    Original Report Authenticated By: Christiana Pellant, M.D.    US Pelvis Complete  11/24/2012  *RADIOLOGY REPORT*  Clinical Data: Irregular menses.  Prior tubal ligation.  TRANSABDOMINAL AND TRANSVAGINAL ULTRASOUND OF PELVIS  Technique:  Both transabdominal and transvaginal ultrasound examinations of the pelvis were performed.  Transabdominal technique was performed for global imaging of the pelvis including uterus, ovaries, adnexal regions, and pelvic cul-de-sac.  It was necessary to proceed with endovaginal exam following the transabdominal exam to visualize the endometrium and ovaries.  Comparison:  05/22/2011  Findings: Uterus:  Anteverted, retroflexed.  No focal abnormality.  Endometrium: 1.3 cm.  Trilaminar in appearance with trace endometrial fluid (assuming negative pregnancy test to exclude a gestational sac).  Right ovary: 3.0 x 2.2 x 2.0 cm.  Normal.  Left ovary: 4.7 x 3.2 x 2.4 cm.  Normal.  Other Findings:  Small free fluid noted in the cul-de-sac.  IMPRESSION: Normal exam.  Trilaminar appearance to the endometrium could obscure a subtle finding, and if irregular bleeding persists, consider follow-up pelvic ultrasound in 4-6 weeks during week immediately following the patient's menses.   Original Report Authenticated By: Christiana Pellant, M.D.     Assessment and Plan   1. Dysfunctional uterine bleeding   2. Bacterial vaginosis   No obvious cause of DUB on u/s, prior u/s showed possible adenomyosis. Cervix appears enlarged on exam and extremely friable. Will treat for BV today, strongly encouraged follow up in clinic for reeval and pap.    Medication List    TAKE these medications       ibuprofen 800 MG tablet  Commonly known as:  ADVIL,MOTRIN  Take 800 mg by mouth every 8 (eight) hours as needed for pain.     metroNIDAZOLE 500 MG tablet  Commonly known as:  FLAGYL  Take 1 tablet (500 mg total) by mouth 2 (two) times daily.            Follow-up Information   Follow up with [facility]. (someone should call to schedule your visit - if no one calls in a few days, you can call to schedule yourself)    Contact information:   [address] [employer] [place] [address] [phone]        [facility] 11/24/2012, 3:36 PM

## 2012-11-26 LAB — GC/CHLAMYDIA PROBE AMP
CT Probe RNA: NEGATIVE
GC Probe RNA: NEGATIVE

## 2013-01-01 ENCOUNTER — Encounter: Payer: Self-pay | Admitting: Obstetrics & Gynecology

## 2013-01-01 ENCOUNTER — Ambulatory Visit (INDEPENDENT_AMBULATORY_CARE_PROVIDER_SITE_OTHER): Payer: BC Managed Care – PPO | Admitting: Obstetrics & Gynecology

## 2013-01-01 VITALS — BP 140/88 | HR 73 | Temp 97.8°F | Ht 68.0 in | Wt 204.0 lb

## 2013-01-01 DIAGNOSIS — N920 Excessive and frequent menstruation with regular cycle: Secondary | ICD-10-CM

## 2013-01-01 DIAGNOSIS — N72 Inflammatory disease of cervix uteri: Secondary | ICD-10-CM

## 2013-01-01 MED ORDER — DOXYCYCLINE HYCLATE 100 MG PO CAPS: 100.0000 mg | ORAL_CAPSULE | Freq: Two times a day (BID) | ORAL | Status: DC

## 2013-01-01 NOTE — Patient Instructions (Signed)
Levonorgestrel intrauterine device (IUD) What is this medicine? LEVONORGESTREL IUD (LEE voe nor jes trel) is a contraceptive (birth control) device. The device is placed inside the uterus by a healthcare professional. It is used to prevent pregnancy and can also be used to treat heavy bleeding that occurs during your period. Depending on the device, it can be used for 3 to 5 years. This medicine may be used for other purposes; ask your health care provider or pharmacist if you have questions. What should I tell my health care provider before I take this medicine? They need to know if you have any of these conditions: -abnormal Pap smear -cancer of the breast, uterus, or cervix -diabetes -endometritis -genital or pelvic infection now or in the past -have more than one sexual partner or your partner has more than one partner -heart disease -history of an ectopic or tubal pregnancy -immune system problems -IUD in place -liver disease or tumor -problems with blood clots or take blood-thinners -use intravenous drugs -uterus of unusual shape -vaginal bleeding that has not been explained -an unusual or allergic reaction to levonorgestrel, other hormones, silicone, or polyethylene, medicines, foods, dyes, or preservatives -pregnant or trying to get pregnant -breast-feeding How should I use this medicine? This device is placed inside the uterus by a health care professional. Talk to your pediatrician regarding the use of this medicine in children. Special care may be needed. Overdosage: If you think you have taken too much of this medicine contact a poison control center or emergency room at once. NOTE: This medicine is only for you. Do not share this medicine with others. What if I miss a dose? This does not apply. What may interact with this medicine? Do not take this medicine with any of the following medications: -amprenavir -bosentan -fosamprenavir This medicine may also interact with  the following medications: -aprepitant -barbiturate medicines for inducing sleep or treating seizures -bexarotene -griseofulvin -medicines to treat seizures like carbamazepine, ethotoin, felbamate, oxcarbazepine, phenytoin, topiramate -modafinil -pioglitazone -rifabutin -rifampin -rifapentine -some medicines to treat HIV infection like atazanavir, indinavir, lopinavir, nelfinavir, tipranavir, ritonavir -St. John's wort -warfarin This list may not describe all possible interactions. Give your health care provider a list of all the medicines, herbs, non-prescription drugs, or dietary supplements you use. Also tell them if you smoke, drink alcohol, or use illegal drugs. Some items may interact with your medicine. What should I watch for while using this medicine? Visit your doctor or health care professional for regular check ups. See your doctor if you or your partner has sexual contact with others, becomes HIV positive, or gets a sexual transmitted disease. This product does not protect you against HIV infection (AIDS) or other sexually transmitted diseases. You can check the placement of the IUD yourself by reaching up to the top of your vagina with clean fingers to feel the threads. Do not pull on the threads. It is a good habit to check placement after each menstrual period. Call your doctor right away if you feel more of the IUD than just the threads or if you cannot feel the threads at all. The IUD may come out by itself. You may become pregnant if the device comes out. If you notice that the IUD has come out use a backup birth control method like condoms and call your health care provider. Using tampons will not change the position of the IUD and are okay to use during your period. What side effects may I notice from receiving this medicine?   Side effects that you should report to your doctor or health care professional as soon as possible: -allergic reactions like skin rash, itching or  hives, swelling of the face, lips, or tongue -fever, flu-like symptoms -genital sores -high blood pressure -no menstrual period for 6 weeks during use -pain, swelling, warmth in the leg -pelvic pain or tenderness -severe or sudden headache -signs of pregnancy -stomach cramping -sudden shortness of breath -trouble with balance, talking, or walking -unusual vaginal bleeding, discharge -yellowing of the eyes or skin Side effects that usually do not require medical attention (report to your doctor or health care professional if they continue or are bothersome): -acne -breast pain -change in sex drive or performance -changes in weight -cramping, dizziness, or faintness while the device is being inserted -headache -irregular menstrual bleeding within first 3 to 6 months of use -nausea This list may not describe all possible side effects. Call your doctor for medical advice about side effects. You may report side effects to FDA at 1-800-FDA-1088. Where should I keep my medicine? This does not apply. NOTE: This sheet is a summary. It may not cover all possible information. If you have questions about this medicine, talk to your doctor, pharmacist, or health care provider.  2013, [employer]. (09/06/2011 1:54:04 PM)  

## 2013-01-01 NOTE — Progress Notes (Signed)
Patient ID: Jasmine Gentry, female   DOB: September 28, 1975, 37 y.o.   MRN: [id number]  Chief Complaint  Patient presents with  . Gynecologic Exam  . Menorrhagia    HPI Jasmine Gentry is a 37 y.o. female.  Seen in [facility] last month for 1.5 years of heavy periods lasting 7-8 days, using many pads. She also has postcoital spotting.  HPI  Past Medical History  Diagnosis Date  . Chlamydia   . Headache     Past Surgical History  Procedure Laterality Date  . Tubal ligation    . Tonsillectomy      Family History  Problem Relation Age of Onset  . Asthma Mother   . Hypertension Mother   . Diabetes Maternal Aunt   . Hypertension Maternal Grandmother     Social History History  Substance Use Topics  . Smoking status: Never Smoker   . Smokeless tobacco: Never Used  . Alcohol Use: No    No Known Allergies  Current Outpatient Prescriptions  Medication Sig Dispense Refill  . ibuprofen (ADVIL,MOTRIN) 800 MG tablet Take 800 mg by mouth every 8 (eight) hours as needed for pain.      Marland Kitchen doxycycline (VIBRAMYCIN) 100 MG capsule Take 1 capsule (100 mg total) by mouth 2 (two) times daily.  14 capsule  0  . metroNIDAZOLE (FLAGYL) 500 MG tablet Take 1 tablet (500 mg total) by mouth 2 (two) times daily.  14 tablet  0   No current facility-administered medications for this visit.    Review of Systems Review of Systems  Constitutional: Negative for fever and fatigue.  Genitourinary: Positive for menstrual problem. Negative for vaginal bleeding, vaginal discharge and pelvic pain.  Skin: Negative for pallor.    Blood pressure 140/88, pulse 73, temperature 97.8 F (36.6 C), temperature source Oral, height 5\' 8"  (1.727 m), weight 204 lb (92.534 kg), last menstrual period 12/25/2012.  Physical Exam Physical Exam  Constitutional: She is oriented to person, place, and time. She appears well-developed. No distress.  Genitourinary: Vagina normal and uterus normal. No vaginal discharge found.   Ectropion and cervix is friable. Pap done. No adnexal mass  Neurological: She is alert and oriented to person, place, and time.  Skin: Skin is warm and dry. No pallor.  Psychiatric: She has a normal mood and affect. Her behavior is normal.    Data Reviewed  *RADIOLOGY REPORT*  Clinical Data: Irregular menses. Prior tubal ligation.  TRANSABDOMINAL AND TRANSVAGINAL ULTRASOUND OF PELVIS  Technique: Both transabdominal and transvaginal ultrasound  examinations of the pelvis were performed. Transabdominal  technique was performed for global imaging of the pelvis including  uterus, ovaries, adnexal regions, and pelvic cul-de-sac.  It was necessary to proceed with endovaginal exam following the  transabdominal exam to visualize the endometrium and ovaries.  Comparison: 05/22/2011  Findings:  Uterus: Anteverted, retroflexed. No focal abnormality.  Endometrium: 1.3 cm. Trilaminar in appearance with trace  endometrial fluid (assuming negative pregnancy test to exclude a  gestational sac).  Right ovary: 3.0 x 2.2 x 2.0 cm. Normal.  Left ovary: 4.7 x 3.2 x 2.4 cm. Normal.  Other Findings: Small free fluid noted in the cul-de-sac.  IMPRESSION:  Normal exam. Trilaminar appearance to the endometrium could  obscure a subtle finding, and if irregular bleeding persists,  consider follow-up pelvic ultrasound in 4-6 weeks during week  immediately following the patient's menses.  Original Report Authenticated By: Christiana Pellant, M.D.  CBC    Component Value Date/Time   WBC  4.7 11/24/2012 1250   RBC 4.63 11/24/2012 1250   HGB 13.8 11/24/2012 1250   HCT 41.6 11/24/2012 1250   PLT 285 11/24/2012 1250   MCV 89.8 11/24/2012 1250   MCH 29.8 11/24/2012 1250   MCHC 33.2 11/24/2012 1250   RDW 13.5 11/24/2012 1250   LYMPHSABS 1.6 12/17/2010 0857   MONOABS 0.5 12/17/2010 0857   EOSABS 0.2 12/17/2010 0857   BASOSABS 0.0 12/17/2010 0857     Assessment    Menorrhagia   Cervicitis  Plan    Review pap Offered OCP,  Mirena , Em ablation. She would like Mirena. RTC 2 weeks. Doxycycline 100 mg BID 7 days        ARNOLD,JAMES 01/01/2013, 5:00 PM

## 2013-01-05 DIAGNOSIS — N72 Inflammatory disease of cervix uteri: Secondary | ICD-10-CM

## 2013-01-05 DIAGNOSIS — N920 Excessive and frequent menstruation with regular cycle: Secondary | ICD-10-CM | POA: Insufficient documentation

## 2013-01-05 HISTORY — DX: Inflammatory disease of cervix uteri: N72

## 2013-01-05 HISTORY — DX: Excessive and frequent menstruation with regular cycle: N92.0

## 2013-01-26 ENCOUNTER — Ambulatory Visit (INDEPENDENT_AMBULATORY_CARE_PROVIDER_SITE_OTHER): Payer: BC Managed Care – PPO | Admitting: Obstetrics & Gynecology

## 2013-01-26 ENCOUNTER — Encounter: Payer: Self-pay | Admitting: Obstetrics & Gynecology

## 2013-01-26 VITALS — BP 119/82 | HR 82 | Ht 68.0 in | Wt 203.9 lb

## 2013-01-26 DIAGNOSIS — Z3009 Encounter for other general counseling and advice on contraception: Secondary | ICD-10-CM

## 2013-01-26 HISTORY — DX: Encounter for other general counseling and advice on contraception: Z30.09

## 2013-01-26 MED ORDER — NORETHIN ACE-ETH ESTRAD-FE 1-20 MG-MCG(24) PO TABS: 1.0000 | ORAL_TABLET | Freq: Every day | ORAL | Status: DC

## 2013-01-26 NOTE — Progress Notes (Signed)
Patient ID: Jasmine Gentry, female   DOB: 1975-10-22, 37 y.o.   MRN: [id number] Patient's last menstrual period was 01/24/2013. Has decided to use OCP for cycle control.  Past Medical History  Diagnosis Date  . Chlamydia   . Headache(784.0)    History   Social History  . Marital Status: Single    Spouse Name: N/A    Number of Children: N/A  . Years of Education: N/A   Occupational History  . Not on file.   Social History Main Topics  . Smoking status: Never Smoker   . Smokeless tobacco: Never Used  . Alcohol Use: No  . Drug Use: No  . Sexually Active: Yes    Birth Control/ Protection: Surgical     Comment: last intercourse 3-4 wks ago   Other Topics Concern  . Not on file   Social History Narrative  . No narrative on file   Filed Vitals:   01/26/13 1515  BP: 119/82  Pulse: 82  Height: 5\' 8"  (1.727 m)  Weight: 203 lb 14.4 oz (92.488 kg)    Imp Menorrhagia, h/o sterilization  Plan  Loestrin 24, RTC 3 months  Adam Phenix, MD 01/26/2013

## 2013-01-26 NOTE — Patient Instructions (Addendum)

## 2013-01-28 ENCOUNTER — Telehealth: Payer: Self-pay | Admitting: *Deleted

## 2013-01-28 NOTE — Telephone Encounter (Signed)
Called and left a message we are returning your call- please call clinic again.

## 2013-01-28 NOTE — Telephone Encounter (Signed)
Jasmine Gentry called and left a message. States she has some questions about birth control pills. States she is confused- that it is her first time taking bcp's.

## 2013-01-29 NOTE — Telephone Encounter (Signed)
Called patient and told her I received her message about questions in regards to her birth control pills. Patient stated she was unsure of when to take the pills. Told her to start the pills the Sunday after her period starts & to try to take the pills around the same time each day. Patient verbalized understanding. Told her to use back up birth control for at least 2 weeks after starting the pills. Patient stated her tubes were tied, so she was covered already. Patient had no further questions

## 2013-02-10 ENCOUNTER — Telehealth (HOSPITAL_COMMUNITY): Payer: Self-pay | Admitting: Obstetrics and Gynecology

## 2013-02-10 NOTE — Telephone Encounter (Signed)
Patient called with c/o bleeding since start of birth control pills. States changing pad every 2-3 hours. Advised patient to allow time for pill to adjust to body. If her bleed worsens where in she is changing saturated pad > 3/hour then she should go to [facility]. Patient agrees and satisfied.

## 2013-02-12 ENCOUNTER — Telehealth: Payer: Self-pay | Admitting: General Practice

## 2013-02-12 DIAGNOSIS — B379 Candidiasis, unspecified: Secondary | ICD-10-CM

## 2013-02-12 MED ORDER — FLUCONAZOLE 150 MG PO TABS: 150.0000 mg | ORAL_TABLET | Freq: Once | ORAL | Status: DC

## 2013-02-12 NOTE — Telephone Encounter (Signed)
Patient called and left message stating she is returning our phone call.Called patient back and she stated and a few days ago she started to have a thick clumpy white d/c and has been itching and can tell it is a yeast infection. Told patient I would send a medicine in for her to [facility]. Patient was satisfied, verbalized understanding and had no further questions

## 2013-02-12 NOTE — Telephone Encounter (Signed)
Patient called and left message stating she needs something called in for a yeast infection. Called patient, no answer- left message that we are returning your phone call and to please call us back.

## 2013-02-26 ENCOUNTER — Telehealth: Payer: Self-pay

## 2013-02-26 NOTE — Telephone Encounter (Signed)
Pt called and and stated that she needs another BCP brand that is cheaper to keep her from paying $60.

## 2013-03-02 ENCOUNTER — Telehealth: Payer: Self-pay | Admitting: Obstetrics and Gynecology

## 2013-03-02 NOTE — Telephone Encounter (Signed)
Opened in error

## 2013-03-04 MED ORDER — NORGESTIMATE-ETH ESTRADIOL 0.25-35 MG-MCG PO TABS: 1.0000 | ORAL_TABLET | Freq: Every day | ORAL | Status: DC

## 2013-03-04 NOTE — Telephone Encounter (Signed)
Consult w/Dr. Macon Large and alternate OCP RX obtained and sent to pharmacy. Pt was notified by phone and voiced understanding.

## 2013-03-09 ENCOUNTER — Encounter (HOSPITAL_COMMUNITY): Payer: Self-pay | Admitting: *Deleted

## 2013-03-09 ENCOUNTER — Emergency Department (HOSPITAL_COMMUNITY)
Admission: EM | Admit: 2013-03-09 | Discharge: 2013-03-09 | Discharge status: Against Medical Advice | Payer: BC Managed Care – PPO | Attending: Emergency Medicine | Admitting: Emergency Medicine

## 2013-03-09 DIAGNOSIS — R51 Headache: Secondary | ICD-10-CM | POA: Insufficient documentation
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
## 2013-03-27 ENCOUNTER — Telehealth: Payer: Self-pay | Admitting: General Practice

## 2013-03-27 DIAGNOSIS — N760 Acute vaginitis: Secondary | ICD-10-CM

## 2013-03-27 DIAGNOSIS — B9689 Other specified bacterial agents as the cause of diseases classified elsewhere: Secondary | ICD-10-CM

## 2013-03-27 MED ORDER — METRONIDAZOLE 500 MG PO TABS: 500.0000 mg | ORAL_TABLET | Freq: Two times a day (BID) | ORAL | Status: DC

## 2013-03-27 NOTE — Telephone Encounter (Signed)
Patient called and left message stating she recently started taking OCPs and thinks she has a bacterial infection and she recently had a yeast infection that she is getting over and would like a call back. Called patient back and she stated that her cycle is about to come on and she is having an irritating d/c with a foul odor and she's had bacterial infections before around this time and that's what it has been. Told patient that we would call in a medicine to [facility]. Patient verbalized understanding and had no further questions

## 2013-05-07 ENCOUNTER — Telehealth: Payer: Self-pay | Admitting: *Deleted

## 2013-05-07 NOTE — Telephone Encounter (Signed)
Patient left message that she thinks the BV came back again. She would like an rx called in.

## 2013-05-11 NOTE — Telephone Encounter (Signed)
Called patient stating I was returning her phone call and after reviewing her chart it appears she needed to followup with [place] around this time anyway, but that we would like to work her in for an appt since she is having recurrent infections. Patient verbalized understanding. Asked if she could come in Wednesday around 12:45, Patient stated that she could, confirmed appt time and had no further questions

## 2013-05-11 NOTE — Telephone Encounter (Signed)
Patient called and left message stating she has another bacterial infection and really would like a call back.

## 2013-05-13 ENCOUNTER — Ambulatory Visit: Payer: BC Managed Care – PPO | Admitting: Obstetrics & Gynecology

## 2013-05-22 ENCOUNTER — Encounter (HOSPITAL_COMMUNITY): Payer: Self-pay | Admitting: *Deleted

## 2013-05-22 ENCOUNTER — Emergency Department (HOSPITAL_COMMUNITY)
Admission: EM | Admit: 2013-05-22 | Discharge: 2013-05-23 | Disposition: A | Discharge status: Home / Self Care | Payer: BC Managed Care – PPO | Attending: Emergency Medicine | Admitting: Emergency Medicine

## 2013-05-22 DIAGNOSIS — Z79899 Other long term (current) drug therapy: Secondary | ICD-10-CM | POA: Insufficient documentation

## 2013-05-22 DIAGNOSIS — G43909 Migraine, unspecified, not intractable, without status migrainosus: Secondary | ICD-10-CM

## 2013-05-22 DIAGNOSIS — Z792 Long term (current) use of antibiotics: Secondary | ICD-10-CM | POA: Insufficient documentation

## 2013-05-22 DIAGNOSIS — Z8619 Personal history of other infectious and parasitic diseases: Secondary | ICD-10-CM | POA: Insufficient documentation

## 2013-05-22 DIAGNOSIS — G43109 Migraine with aura, not intractable, without status migrainosus: Secondary | ICD-10-CM | POA: Insufficient documentation

## 2013-05-22 MED ORDER — DIPHENHYDRAMINE HCL 50 MG/ML IJ SOLN
50.0000 mg | Freq: Once | INTRAMUSCULAR | Status: AC
  Administered 2013-05-22: 50 mg via INTRAVENOUS
  Filled 2013-05-22: qty 1

## 2013-05-22 MED ORDER — DEXAMETHASONE SODIUM PHOSPHATE 10 MG/ML IJ SOLN
10.0000 mg | Freq: Once | INTRAMUSCULAR | Status: AC
  Administered 2013-05-22: 10 mg via INTRAVENOUS
  Filled 2013-05-22: qty 1

## 2013-05-22 MED ORDER — KETOROLAC TROMETHAMINE 30 MG/ML IJ SOLN
30.0000 mg | Freq: Once | INTRAMUSCULAR | Status: AC
  Administered 2013-05-22: 30 mg via INTRAVENOUS
  Filled 2013-05-22: qty 1

## 2013-05-22 MED ORDER — METOCLOPRAMIDE HCL 5 MG/ML IJ SOLN
10.0000 mg | Freq: Once | INTRAMUSCULAR | Status: AC
  Administered 2013-05-22: 10 mg via INTRAVENOUS
  Filled 2013-05-22: qty 2

## 2013-05-22 MED ORDER — SODIUM CHLORIDE 0.9 % IV BOLUS (SEPSIS)
1000.0000 mL | Freq: Once | INTRAVENOUS | Status: AC
  Administered 2013-05-22: 1000 mL via INTRAVENOUS

## 2013-05-22 NOTE — ED Notes (Signed)
Patient states that yesterday evening she gradually began to have a headache that became progressively worse. She has taken ibuprofen with minimal relief. She has nausea but denies vomiting. Sensitivity to light.

## 2013-05-22 NOTE — ED Provider Notes (Signed)
CSN: [id number]     Arrival date & time 05/22/13  2244 History   First MD Initiated Contact with Patient 05/22/13 2257     Chief Complaint  Patient presents with  . Migraine   (Consider location/radiation/quality/duration/timing/severity/associated sxs/prior Treatment) HPI Comments: Patient is a 37 year old female with history of migraines who presents today with gradual onset headache since yesterday. She reports that this pain is the same pain as her normal migraines. She had an aura. She currently is having a throbbing pain in the front of her head. She has associated photophobia and nausea. She denies any visual changes including double vision or blurry vision. She has not vomited. She took 3 ibuprofen today without any relief of her headache. She had been seeing her primary care doctor who had been giving her gabapentin for these migraines. She reports she stopped taking that one year ago because "I don't know, I was sick of taking it".  The history is provided by the patient. No language interpreter was used.    Past Medical History  Diagnosis Date  . Chlamydia   . WUJWJXBJ(478.2)    Past Surgical History  Procedure Laterality Date  . Tubal ligation    . Tonsillectomy     Family History  Problem Relation Age of Onset  . Asthma Mother   . Hypertension Mother   . Diabetes Maternal Aunt   . Hypertension Maternal Grandmother    History  Substance Use Topics  . Smoking status: Never Smoker   . Smokeless tobacco: Never Used  . Alcohol Use: No   OB History   Grav Para Term Preterm Abortions TAB SAB Ect Mult Living   4 3 3  0 1 0 1 0 0 3     Review of Systems  Constitutional: Negative for fever and chills.  Eyes: Positive for photophobia. Negative for visual disturbance.  Respiratory: Negative for shortness of breath.   Cardiovascular: Negative for chest pain.  Gastrointestinal: Positive for nausea. Negative for vomiting and abdominal pain.  Neurological: Positive for  headaches. Negative for weakness and numbness.  All other systems reviewed and are negative.    Allergies  Review of patient's allergies indicates no known allergies.  Home Medications   Current Outpatient Rx  Name  Route  Sig  Dispense  Refill  . metroNIDAZOLE (FLAGYL) 500 MG tablet   Oral   Take 1 tablet (500 mg total) by mouth 2 (two) times daily.   14 tablet   0   . norgestimate-ethinyl estradiol (ORTHO-CYCLEN,SPRINTEC,PREVIFEM) 0.25-35 MG-MCG tablet   Oral   Take 1 tablet by mouth daily.   1 Package   11    BP 143/84  Pulse 70  Temp(Src) 98.1 F (36.7 C) (Oral)  Resp 20  SpO2 100% Physical Exam  Nursing note and vitals reviewed. Constitutional: She is oriented to person, place, and time. She appears well-developed and well-nourished. No distress.  HENT:  Head: Normocephalic and atraumatic.  Right Ear: External ear normal.  Left Ear: External ear normal.  Nose: Nose normal.  Mouth/Throat: Oropharynx is clear and moist.  No temporal artery tenderness  Eyes: Conjunctivae and EOM are normal. Pupils are equal, round, and reactive to light.  Neck: Trachea normal, normal range of motion and phonation normal.  No nuchal rigidity or meningeal sign  Cardiovascular: Normal rate, regular rhythm and normal heart sounds.   Pulmonary/Chest: Effort normal and breath sounds normal. No stridor. No respiratory distress. She has no wheezes. She has no rales.  Abdominal: Soft.  She exhibits no distension.  Musculoskeletal: Normal range of motion.  Neurological: She is alert and oriented to person, place, and time. She has normal strength. No sensory deficit. Coordination and gait normal.  No pronator drift. Finger-nose-finger normal. Heel shin and knee normal.  Skin: Skin is warm and dry. She is not diaphoretic. No erythema.  Psychiatric: She has a normal mood and affect. Her behavior is normal.    ED Course  Procedures (including critical care time) Labs Review Labs Reviewed  - No data to display Imaging Review No results found.  MDM   1. Migraine    Pt HA treated and improved while in ED.  Presentation is like pts typical HA and non concerning for [facility], ICH, Meningitis, or temporal arteritis. Pt is afebrile with no focal neuro deficits, nuchal rigidity, or change in vision. Pt is to follow up with PCP to discuss prophylactic medication. Pt verbalizes understanding and is agreeable with plan to dc.      Mora Bellman, PA-C 05/23/13 [phone]

## 2013-05-22 NOTE — ED Notes (Signed)
Pt states she has migraines but not prescribed medication for them and hasn't seen a neurologist,  Pt states she has taken a total of 3 800mg  motrin since yesterday when headache started

## 2013-05-23 NOTE — ED Provider Notes (Signed)
Medical screening examination/treatment/procedure(s) were performed by non-physician practitioner and as supervising physician I was immediately available for consultation/collaboration.  Coraline Talwar, MD 05/23/13 0249 

## 2013-06-15 ENCOUNTER — Emergency Department (HOSPITAL_COMMUNITY)
Admission: EM | Admit: 2013-06-15 | Discharge: 2013-06-16 | Disposition: A | Discharge status: Home / Self Care | Payer: BC Managed Care – PPO | Attending: Emergency Medicine | Admitting: Emergency Medicine

## 2013-06-15 ENCOUNTER — Encounter (HOSPITAL_COMMUNITY): Payer: Self-pay | Admitting: Emergency Medicine

## 2013-06-15 DIAGNOSIS — X500XXA Overexertion from strenuous movement or load, initial encounter: Secondary | ICD-10-CM | POA: Insufficient documentation

## 2013-06-15 DIAGNOSIS — T148XXA Other injury of unspecified body region, initial encounter: Secondary | ICD-10-CM

## 2013-06-15 DIAGNOSIS — Y9239 Other specified sports and athletic area as the place of occurrence of the external cause: Secondary | ICD-10-CM | POA: Insufficient documentation

## 2013-06-15 DIAGNOSIS — IMO0002 Reserved for concepts with insufficient information to code with codable children: Secondary | ICD-10-CM | POA: Insufficient documentation

## 2013-06-15 DIAGNOSIS — Z8619 Personal history of other infectious and parasitic diseases: Secondary | ICD-10-CM | POA: Insufficient documentation

## 2013-06-15 DIAGNOSIS — Y9369 Activity, other involving other sports and athletics played as a team or group: Secondary | ICD-10-CM | POA: Insufficient documentation

## 2013-06-15 NOTE — ED Notes (Signed)
Pt states she was playing softball and bent over to pick it up and when she went to stand back up she had a pain in her back and states it hurts to breathe

## 2013-06-16 ENCOUNTER — Emergency Department (HOSPITAL_COMMUNITY): Payer: BC Managed Care – PPO

## 2013-06-16 MED ORDER — KETOROLAC TROMETHAMINE 60 MG/2ML IM SOLN
60.0000 mg | Freq: Once | INTRAMUSCULAR | Status: AC
  Administered 2013-06-16: 60 mg via INTRAMUSCULAR
  Filled 2013-06-16: qty 2

## 2013-06-16 MED ORDER — DIAZEPAM 5 MG PO TABS: 5.0000 mg | ORAL_TABLET | Freq: Two times a day (BID) | ORAL | Status: DC

## 2013-06-16 MED ORDER — DIAZEPAM 5 MG PO TABS
5.0000 mg | ORAL_TABLET | Freq: Once | ORAL | Status: AC
  Administered 2013-06-16: 5 mg via ORAL
  Filled 2013-06-16: qty 1

## 2013-06-16 MED ORDER — IBUPROFEN 800 MG PO TABS: 800.0000 mg | ORAL_TABLET | Freq: Three times a day (TID) | ORAL | Status: DC

## 2013-06-16 NOTE — ED Provider Notes (Signed)
Medical screening examination/treatment/procedure(s) were performed by non-physician practitioner and as supervising physician I was immediately available for consultation/collaboration.  Evalisse Prajapati M Tron Flythe, MD 06/16/13 0132 

## 2013-06-16 NOTE — ED Provider Notes (Signed)
CSN: [id number]     Arrival date & time 06/15/13  2227 History   First MD Initiated Contact with Patient 06/15/13 2355     Chief Complaint  Patient presents with  . Back Pain   (Consider location/radiation/quality/duration/timing/severity/associated sxs/prior Treatment) HPI History provided by pt.   Pt was reaching for a ground ball during a softball game this evening, felt a "pull" in right upper back and immediately developed severe, constant, non-radiating, sharp pain that is aggravated by movement and deep inspiration.  Has not taken anything for sx.  No associated fever, cough, SOB, extremity weakness/paresthesias.  Has had similar pain in the past and believes she pulled a muscle. Past Medical History  Diagnosis Date  . Chlamydia   . WUJWJXBJ(478.2)    Past Surgical History  Procedure Laterality Date  . Tubal ligation    . Tonsillectomy    . Carpel tunnel release     Family History  Problem Relation Age of Onset  . Asthma Mother   . Hypertension Mother   . Diabetes Maternal Aunt   . Hypertension Maternal Grandmother    History  Substance Use Topics  . Smoking status: Never Smoker   . Smokeless tobacco: Never Used  . Alcohol Use: No   OB History   Grav Para Term Preterm Abortions TAB SAB Ect Mult Living   4 3 3  0 1 0 1 0 0 3     Review of Systems  All other systems reviewed and are negative.    Allergies  Review of patient's allergies indicates no known allergies.  Home Medications   Current Outpatient Rx  Name  Route  Sig  Dispense  Refill  . ibuprofen (ADVIL,MOTRIN) 200 MG tablet   Oral   Take 800 mg by mouth every 6 (six) hours as needed for pain.          BP 129/81  Pulse 80  Temp(Src) 98.4 F (36.9 C) (Oral)  Resp 16  SpO2 97%  LMP 05/19/2013 Physical Exam  Nursing note and vitals reviewed. Constitutional: She is oriented to person, place, and time. She appears well-developed and well-nourished. No distress.  HENT:  Head: Normocephalic  and atraumatic.  Eyes:  Normal appearance  Neck: Normal range of motion.  Cardiovascular: Normal rate and regular rhythm.   Pulmonary/Chest: Effort normal and breath sounds normal. No respiratory distress.  Pleuritic pain reported in right upper back.  Musculoskeletal: Normal range of motion.  Entire back, including spine is non-tender.  2+ radial and DP pulses, 5/5 grip, hip abductor/adductor and plantar/dorsflexion strength.  Sensation intact all four extremities.  No LE edema/calf ttp.  Neurological: She is alert and oriented to person, place, and time.  Skin: Skin is warm and dry. No rash noted.  Psychiatric: She has a normal mood and affect. Her behavior is normal.    ED Course  Procedures (including critical care time) Labs Review Labs Reviewed - No data to display Imaging Review Dg Chest 2 View  06/16/2013   CLINICAL DATA:  Softball injury, pain.  EXAM: CHEST  2 VIEW  COMPARISON:  06/16/2011  FINDINGS: The heart size and mediastinal contours are within normal limits. Both lungs are clear. The visualized skeletal structures are unremarkable. No pneumothorax or effusion.  IMPRESSION: No active cardiopulmonary disease.   Electronically Signed   By: Oley Balm M.D.   On: 06/16/2013 00:41    EKG Interpretation   None       MDM   1. Muscle strain  37yo F presents w/ right upper back pain, onset this evening while bending to pick up a ground ball on softball field.  History most consistent w/ muscle strain but because the pain is not reproducible w/ palpation, CXR ordered to r/o spontaneous pneumothorax.   No exam findings concerning for PE.  Pt to receive valium and IM toradol.  12:26 AM   CXR negative.  Results discussed w/ pt.  D/c'd home w/ 6 valium as well as 800mg  ibuprofen.  Recommended warm compresses and avoidance of aggravating activities.  Return precautions discussed. 1:16 AM   Otilio Miu, PA-C 06/16/13 0116  Otilio Miu,  PA-C 06/16/13 [phone]

## 2013-08-17 ENCOUNTER — Telehealth: Payer: Self-pay | Admitting: *Deleted

## 2013-08-17 DIAGNOSIS — B9689 Other specified bacterial agents as the cause of diseases classified elsewhere: Secondary | ICD-10-CM

## 2013-08-17 NOTE — Telephone Encounter (Signed)
Pt called nurse line requesting medication for bacterial infection.

## 2013-08-18 ENCOUNTER — Encounter: Payer: Self-pay | Admitting: *Deleted

## 2013-08-18 MED ORDER — METRONIDAZOLE 500 MG PO TABS: 500.0000 mg | ORAL_TABLET | Freq: Two times a day (BID) | ORAL | Status: DC

## 2013-08-18 NOTE — Telephone Encounter (Signed)
Spoke with patient, confirmed fishy odor discharge.  Ordered Flagyl. Informed patient she needs an appointment and we can not order this medication without her being seen.

## 2013-09-18 ENCOUNTER — Emergency Department (HOSPITAL_COMMUNITY)
Admission: EM | Admit: 2013-09-18 | Discharge: 2013-09-18 | Disposition: A | Discharge status: Home / Self Care | Payer: BC Managed Care – PPO | Attending: Emergency Medicine | Admitting: Emergency Medicine

## 2013-09-18 ENCOUNTER — Encounter (HOSPITAL_COMMUNITY): Payer: Self-pay | Admitting: Emergency Medicine

## 2013-09-18 DIAGNOSIS — Z791 Long term (current) use of non-steroidal anti-inflammatories (NSAID): Secondary | ICD-10-CM | POA: Insufficient documentation

## 2013-09-18 DIAGNOSIS — Z79899 Other long term (current) drug therapy: Secondary | ICD-10-CM | POA: Insufficient documentation

## 2013-09-18 DIAGNOSIS — Z792 Long term (current) use of antibiotics: Secondary | ICD-10-CM | POA: Insufficient documentation

## 2013-09-18 DIAGNOSIS — Z8619 Personal history of other infectious and parasitic diseases: Secondary | ICD-10-CM | POA: Insufficient documentation

## 2013-09-18 DIAGNOSIS — J111 Influenza due to unidentified influenza virus with other respiratory manifestations: Secondary | ICD-10-CM

## 2013-09-18 MED ORDER — OSELTAMIVIR PHOSPHATE 75 MG PO CAPS: 75.0000 mg | ORAL_CAPSULE | Freq: Two times a day (BID) | ORAL | Status: DC

## 2013-09-18 NOTE — Discharge Instructions (Signed)
Get plenty of rest, and drink a lot of fluids. Use Tylenol or Motrin for pain or fever. Return here if needed for problems.    Influenza, Adult Influenza ("the flu") is a viral infection of the respiratory tract. It occurs more often in winter months because people spend more time in close contact with one another. Influenza can make you feel very sick. Influenza easily spreads from person to person (contagious). CAUSES  Influenza is caused by a virus that infects the respiratory tract. You can catch the virus by breathing in droplets from an infected person's cough or sneeze. You can also catch the virus by touching something that was recently contaminated with the virus and then touching your mouth, nose, or eyes. SYMPTOMS  Symptoms typically last 4 to 10 days and may include:  Fever.  Chills.  Headache, body aches, and muscle aches.  Sore throat.  Chest discomfort and cough.  Poor appetite.  Weakness or feeling tired.  Dizziness.  Nausea or vomiting. DIAGNOSIS  Diagnosis of influenza is often made based on your history and a physical exam. A nose or throat swab test can be done to confirm the diagnosis. RISKS AND COMPLICATIONS You may be at risk for a more severe case of influenza if you smoke cigarettes, have diabetes, have chronic heart disease (such as heart failure) or lung disease (such as asthma), or if you have a weakened immune system. Elderly people and pregnant women are also at risk for more serious infections. The most common complication of influenza is a lung infection (pneumonia). Sometimes, this complication can require emergency medical care and may be life-threatening. PREVENTION  An annual influenza vaccination (flu shot) is the best way to avoid getting influenza. An annual flu shot is now routinely recommended for all adults in the [place] TREATMENT  In mild cases, influenza goes away on its own. Treatment is directed at relieving symptoms. For more severe  cases, your caregiver may prescribe antiviral medicines to shorten the sickness. Antibiotic medicines are not effective, because the infection is caused by a virus, not by bacteria. HOME CARE INSTRUCTIONS  Only take over-the-counter or prescription medicines for pain, discomfort, or fever as directed by your caregiver.  Use a cool mist humidifier to make breathing easier.  Get plenty of rest until your temperature returns to normal. This usually takes 3 to 4 days.  Drink enough fluids to keep your urine clear or pale yellow.  Cover your mouth and nose when coughing or sneezing, and wash your hands well to avoid spreading the virus.  Stay home from work or school until your fever has been gone for at least 1 full day. SEEK MEDICAL CARE IF:   You have chest pain or a deep cough that worsens or produces more mucus.  You have nausea, vomiting, or diarrhea. SEEK IMMEDIATE MEDICAL CARE IF:   You have difficulty breathing, shortness of breath, or your skin or nails turn bluish.  You have severe neck pain or stiffness.  You have a severe headache, facial pain, or earache.  You have a worsening or recurring fever.  You have nausea or vomiting that cannot be controlled. MAKE SURE YOU:  Understand these instructions.  Will watch your condition.  Will get help right away if you are not doing well or get worse. Document Released: 08/03/2000 Document Revised: 02/05/2012 Document Reviewed: 11/05/2011 [facility] Patient Information 2014 [employer], [place].

## 2013-09-18 NOTE — ED Provider Notes (Signed)
CSN: [id number]     Arrival date & time 09/18/13  0756 History   First MD Initiated Contact with Patient 09/18/13 [phone]     Chief Complaint  Patient presents with  . Cough  . Generalized Body Aches  . Nasal Congestion   (Consider location/radiation/quality/duration/timing/severity/associated sxs/prior Treatment) Patient is a 38 y.o. female presenting with cough. The history is provided by the patient.  Cough  She has been ill for 3 days, where cough, rhinorrhea, sore throat, myalgias, and sneezing. She denies weakness, dizziness, nausea, vomiting. She is sick contacts at home. She works in a Engineer, materialsmanufacturing position. She is using her medications, without relief.  Past Medical History  Diagnosis Date  . Chlamydia   . JXBJYNWG(956.2Headache(784.0)    Past Surgical History  Procedure Laterality Date  . Tubal ligation    . Tonsillectomy    . Carpel tunnel release     Family History  Problem Relation Age of Onset  . Asthma Mother   . Hypertension Mother   . Diabetes Maternal Aunt   . Hypertension Maternal Grandmother    History  Substance Use Topics  . Smoking status: Never Smoker   . Smokeless tobacco: Never Used  . Alcohol Use: No   OB History   Grav Para Term Preterm Abortions TAB SAB Ect Mult Living   4 3 3  0 1 0 1 0 0 3     Review of Systems  Respiratory: Positive for cough.   All other systems reviewed and are negative.    Allergies  Review of patient's allergies indicates no known allergies.  Home Medications   Current Outpatient Rx  Name  Route  Sig  Dispense  Refill  . diazepam (VALIUM) 5 MG tablet   Oral   Take 1 tablet (5 mg total) by mouth 2 (two) times daily.   6 tablet   0   . ibuprofen (ADVIL,MOTRIN) 200 MG tablet   Oral   Take 800 mg by mouth every 6 (six) hours as needed for pain.         Marland Kitchen. ibuprofen (ADVIL,MOTRIN) 800 MG tablet   Oral   Take 1 tablet (800 mg total) by mouth 3 (three) times daily.   12 tablet   0   . metroNIDAZOLE (FLAGYL) 500 MG  tablet   Oral   Take 1 tablet (500 mg total) by mouth 2 (two) times daily.   14 tablet   0   . oseltamivir (TAMIFLU) 75 MG capsule   Oral   Take 1 capsule (75 mg total) by mouth every 12 (twelve) hours.   10 capsule   0    BP 138/76  Pulse 88  Temp(Src) 98.2 F (36.8 C) (Oral)  Resp 16  SpO2 98%  LMP 08/18/2013 Physical Exam  Nursing note and vitals reviewed. Constitutional: She is oriented to person, place, and time. She appears well-developed and well-nourished.  HENT:  Head: Normocephalic and atraumatic.  Eyes: Conjunctivae and EOM are normal. Pupils are equal, round, and reactive to light.  Neck: Normal range of motion and phonation normal. Neck supple.  Cardiovascular: Normal rate, regular rhythm and intact distal pulses.   Pulmonary/Chest: Effort normal and breath sounds normal. She exhibits no tenderness.  Occasional nonproductive cough  Abdominal: Soft. She exhibits no distension. There is no tenderness. There is no guarding.  Musculoskeletal: Normal range of motion.  Neurological: She is alert and oriented to person, place, and time. She exhibits normal muscle tone.  Skin: Skin is warm and  dry.  Psychiatric: She has a normal mood and affect. Her behavior is normal. Judgment and thought content normal.    ED Course  Procedures (including critical care time)    Findings discussed with the patient. She prefers a trial of antiviral medication, even, though she's been sick greater than 48 hours.  MDM   1. Influenza    Illness, consistent with pandemic influenza. She is low risk for complications.  Nursing Notes Reviewed/ Care Coordinated Applicable Imaging Reviewed Interpretation of Laboratory Data incorporated into ED treatment  The patient appears reasonably screened and/or stabilized for discharge and I doubt any other medical condition or other [facility] requiring further screening, evaluation, or treatment in the ED at this time prior to discharge.  Plan:  Home Medications- Tamiflu; Home Treatments- rest, fluids; return here if the recommended treatment, does not improve the symptoms; Recommended follow up- PCP prn    Flint Melter, MD 09/18/13 [phone]

## 2013-09-18 NOTE — ED Notes (Signed)
Pt from home c/o flu like s/sx x3 days. Pt denies N/V/D. Pt is A&O and in NAD

## 2013-10-22 ENCOUNTER — Telehealth: Payer: Self-pay | Admitting: General Practice

## 2013-10-22 MED ORDER — FLUCONAZOLE 150 MG PO TABS: 150.0000 mg | ORAL_TABLET | Freq: Once | ORAL | Status: DC

## 2013-10-22 NOTE — Telephone Encounter (Addendum)
Patient returned call and stated that she needs an rx for a yeast infection.  1650 - Called pt and she reports having white, clumpy vaginal D/C and itching. I questioned pt as to why she had not kept her appt in Sept of last year when she was having sx of BV. Pt stated that she did not know she had an appt then. I explained that I can send in the Rx for Diflucan and she will need to keep appt on 11/30/13 @ 2:00p for follow up. Pt agreed and voiced understanding.

## 2013-10-22 NOTE — Telephone Encounter (Signed)
Patient called and left message stating she would like a nurse to call her in something for a yeast infection. Per chart review patient has called 3-4 times requesting medication for BV or yeast infections and has no showed for a work in appt to evaluate her frequent infections. Made appt for 4/13 @ 2pm for patient due to recurrent infections. Called patient, no answer- left message stating I am trying to return your phone call from earlier, please give us a call back at the clinics.

## 2013-11-09 ENCOUNTER — Encounter: Payer: Self-pay | Admitting: *Deleted

## 2013-11-30 ENCOUNTER — Ambulatory Visit: Payer: BC Managed Care – PPO | Admitting: Obstetrics & Gynecology

## 2014-01-01 ENCOUNTER — Ambulatory Visit (INDEPENDENT_AMBULATORY_CARE_PROVIDER_SITE_OTHER): Payer: Medicaid Other | Admitting: Nurse Practitioner

## 2014-01-01 ENCOUNTER — Encounter: Payer: Self-pay | Admitting: Nurse Practitioner

## 2014-01-01 ENCOUNTER — Other Ambulatory Visit (HOSPITAL_COMMUNITY)
Admission: RE | Admit: 2014-01-01 | Discharge: 2014-01-01 | Disposition: A | Discharge status: Home / Self Care | Payer: Medicaid Other | Source: Ambulatory Visit | Attending: Nurse Practitioner | Admitting: Nurse Practitioner

## 2014-01-01 VITALS — BP 131/83 | HR 78 | Ht 68.0 in | Wt 213.6 lb

## 2014-01-01 DIAGNOSIS — Z113 Encounter for screening for infections with a predominantly sexual mode of transmission: Secondary | ICD-10-CM | POA: Insufficient documentation

## 2014-01-01 DIAGNOSIS — N920 Excessive and frequent menstruation with regular cycle: Secondary | ICD-10-CM

## 2014-01-01 DIAGNOSIS — Z124 Encounter for screening for malignant neoplasm of cervix: Secondary | ICD-10-CM | POA: Insufficient documentation

## 2014-01-01 DIAGNOSIS — Z0000 Encounter for general adult medical examination without abnormal findings: Secondary | ICD-10-CM

## 2014-01-01 DIAGNOSIS — Z1151 Encounter for screening for human papillomavirus (HPV): Secondary | ICD-10-CM | POA: Insufficient documentation

## 2014-01-01 DIAGNOSIS — Z01419 Encounter for gynecological examination (general) (routine) without abnormal findings: Secondary | ICD-10-CM

## 2014-01-01 DIAGNOSIS — E669 Obesity, unspecified: Secondary | ICD-10-CM | POA: Insufficient documentation

## 2014-01-01 MED ORDER — ETONOGESTREL-ETHINYL ESTRADIOL 0.12-0.015 MG/24HR VA RING
VAGINAL_RING | VAGINAL | Status: DC
Start: 2014-01-01 — End: 2014-01-10

## 2014-01-01 NOTE — Patient Instructions (Signed)

## 2014-01-01 NOTE — Progress Notes (Signed)
History:  Jasmine Gentry is a 38 y.o. [phone] who [facility] well woman exam and to change birth control. She had BTL 8 years ago and since has been on BCPs for abnormal bleeding. She would like to change to something else. She has no new partner. No vaginal symptoms. She is currently having a monthly cycle and had it last week  The following portions of the patient's history were reviewed and updated as appropriate: allergies, current medications, past family history, past medical history, past social history, past surgical history and problem list.  Review of Systems:  Pertinent items are noted in HPI.  Objective:  Physical Exam BP 131/83  Pulse 78  Ht 5\' 8"  (1.727 m)  Wt 213 lb 9.6 oz (96.888 kg)  BMI 32.49 kg/m2  LMP 12/23/2013 GENERAL: Well-developed, well-nourished female in no acute distress. Obese HEENT: Normocephalic, atraumatic.  NECK: Supple. Normal thyroid.  LUNGS: Normal rate. Clear to auscultation bilaterally.  HEART: Regular rate and rhythm with no adventitious sounds.  BREASTS: Symmetric in size. No masses, skin changes, nipple drainage, or lymphadenopathy. ABDOMEN: Soft, nontender, nondistended. No organomegaly. Normal bowel sounds appreciated in all quadrants.  PELVIC: Normal external female genitalia. Vagina is pink and rugated.  Normal discharge. Cervix is friable. Pap smear obtained. Uterus is normal in size. No adnexal mass or tenderness.  EXTREMITIES: No cyanosis, clubbing, or edema, 2+ distal pulses.   Labs and Imaging No results found.  Assessment & Plan:  Assessment:  Annual Exam Menorrhagia   Plans: Will switch from BCP to Nuvaring Pap smear today/ will call with results Follow up 1 year  Jasmine PhenixLinda M Zyaire Dumas, NP 01/01/2014 9:41 AM

## 2014-01-01 NOTE — Addendum Note (Signed)
Addended by: Candelaria StagersHAIZLIP, Arael Piccione E on: 01/01/2014 11:57 AM   Modules accepted: Orders

## 2014-01-10 ENCOUNTER — Emergency Department (HOSPITAL_COMMUNITY)
Admission: EM | Admit: 2014-01-10 | Discharge: 2014-01-11 | Disposition: A | Discharge status: Home / Self Care | Payer: Medicaid Other | Attending: Emergency Medicine | Admitting: Emergency Medicine

## 2014-01-10 ENCOUNTER — Encounter (HOSPITAL_COMMUNITY): Payer: Self-pay | Admitting: Emergency Medicine

## 2014-01-10 DIAGNOSIS — J3489 Other specified disorders of nose and nasal sinuses: Secondary | ICD-10-CM | POA: Insufficient documentation

## 2014-01-10 DIAGNOSIS — S199XXA Unspecified injury of neck, initial encounter: Principal | ICD-10-CM

## 2014-01-10 DIAGNOSIS — R630 Anorexia: Secondary | ICD-10-CM | POA: Insufficient documentation

## 2014-01-10 DIAGNOSIS — W219XXA Striking against or struck by unspecified sports equipment, initial encounter: Secondary | ICD-10-CM | POA: Insufficient documentation

## 2014-01-10 DIAGNOSIS — Y9239 Other specified sports and athletic area as the place of occurrence of the external cause: Secondary | ICD-10-CM | POA: Insufficient documentation

## 2014-01-10 DIAGNOSIS — Y92838 Other recreation area as the place of occurrence of the external cause: Secondary | ICD-10-CM

## 2014-01-10 DIAGNOSIS — R040 Epistaxis: Secondary | ICD-10-CM

## 2014-01-10 DIAGNOSIS — R0609 Other forms of dyspnea: Secondary | ICD-10-CM | POA: Insufficient documentation

## 2014-01-10 DIAGNOSIS — S0992XA Unspecified injury of nose, initial encounter: Secondary | ICD-10-CM

## 2014-01-10 DIAGNOSIS — Y936A Activity, physical games generally associated with school recess, summer camp and children: Secondary | ICD-10-CM | POA: Insufficient documentation

## 2014-01-10 DIAGNOSIS — R0989 Other specified symptoms and signs involving the circulatory and respiratory systems: Secondary | ICD-10-CM | POA: Insufficient documentation

## 2014-01-10 DIAGNOSIS — Y9389 Activity, other specified: Secondary | ICD-10-CM | POA: Insufficient documentation

## 2014-01-10 DIAGNOSIS — Z975 Presence of (intrauterine) contraceptive device: Secondary | ICD-10-CM | POA: Insufficient documentation

## 2014-01-10 DIAGNOSIS — Z8619 Personal history of other infectious and parasitic diseases: Secondary | ICD-10-CM | POA: Insufficient documentation

## 2014-01-10 DIAGNOSIS — S0993XA Unspecified injury of face, initial encounter: Secondary | ICD-10-CM | POA: Insufficient documentation

## 2014-01-10 DIAGNOSIS — R21 Rash and other nonspecific skin eruption: Secondary | ICD-10-CM | POA: Insufficient documentation

## 2014-01-10 NOTE — ED Notes (Signed)
Pt states he was hit in the nose with an elbow while playing kickball. Pt states she was just not able to control bleeding, and it very painful to palpation

## 2014-01-11 ENCOUNTER — Emergency Department (HOSPITAL_COMMUNITY): Payer: Medicaid Other

## 2014-01-11 MED ORDER — OXYMETAZOLINE HCL 0.05 % NA SOLN
1.0000 | Freq: Once | NASAL | Status: AC
  Administered 2014-01-11: 1 via NASAL
  Filled 2014-01-11: qty 15

## 2014-01-11 MED ORDER — ONDANSETRON 4 MG PO TBDP
4.0000 mg | ORAL_TABLET | Freq: Once | ORAL | Status: AC
  Administered 2014-01-11: 4 mg via ORAL
  Filled 2014-01-11: qty 1

## 2014-01-11 MED ORDER — HYDROCODONE-ACETAMINOPHEN 5-325 MG PO TABS: 1.0000 | ORAL_TABLET | Freq: Four times a day (QID) | ORAL | Status: DC | PRN

## 2014-01-11 MED ORDER — OXYCODONE-ACETAMINOPHEN 5-325 MG PO TABS
2.0000 | ORAL_TABLET | Freq: Once | ORAL | Status: AC
  Administered 2014-01-11: 2 via ORAL
  Filled 2014-01-11: qty 2

## 2014-01-11 NOTE — Discharge Instructions (Signed)
Your   Nosebleed Nosebleeds can be caused by many conditions including trauma, infections, polyps, foreign bodies, dry mucous membranes or climate, medications and air conditioning. Most nosebleeds occur in the front of the nose. It is because of this location that most nosebleeds can be controlled by pinching the nostrils gently and continuously. Do this for at least 10 to 20 minutes. The reason for this long continuous pressure is that you must hold it long enough for the blood to clot. If during that 10 to 20 minute time period, pressure is released, the process may have to be started again. The nosebleed may stop by itself, quit with pressure, need concentrated heating (cautery) or stop with pressure from packing. HOME CARE INSTRUCTIONS   If your nose was packed, try to maintain the pack inside until your caregiver removes it. If a gauze pack was used and it starts to fall out, gently replace or cut the end off. Do not cut if a balloon catheter was used to pack the nose. Otherwise, do not remove unless instructed.  Avoid blowing your nose for 12 hours after treatment. This could dislodge the pack or clot and start bleeding again.  If the bleeding starts again, sit up and bending forward, gently pinch the front half of your nose continuously for 20 minutes.  If bleeding was caused by dry mucous membranes, cover the inside of your nose every morning with a petroleum or antibiotic ointment. Use your little fingertip as an applicator. Do this as needed during dry weather. This will keep the mucous membranes moist and allow them to heal.  Maintain humidity in your home by using less air conditioning or using a humidifier.  Do not use aspirin or medications which make bleeding more likely. Your caregiver can give you recommendations on this.  Resume normal activities as able but try to avoid straining, lifting or bending at the waist for several days.  If the nosebleeds become recurrent and the  cause is unknown, your caregiver may suggest laboratory tests. SEEK IMMEDIATE MEDICAL CARE IF:   Bleeding recurs and cannot be controlled.  There is unusual bleeding from or bruising on other parts of the body.  You have a fever.  Nosebleeds continue.  There is any worsening of the condition which originally brought you in.  You become lightheaded, feel faint, become sweaty or vomit blood. MAKE SURE YOU:   Understand these instructions.  Will watch your condition.  Will get help right away if you are not doing well or get worse. Document Released: 05/16/2005 Document Revised: 10/29/2011 Document Reviewed: 07/08/2009 [facility] Patient Information 2014 [place], [place].  Blunt Trauma You have been evaluated for injuries. You have been examined and your caregiver has not found injuries serious enough to require hospitalization. It is common to have multiple bruises and sore muscles following an accident. These tend to feel worse for the first 24 hours. You will feel more stiffness and soreness over the next several hours and worse when you wake up the first morning after your accident. After this point, you should begin to improve with each passing day. The amount of improvement depends on the amount of damage done in the accident. Following your accident, if some part of your body does not work as it should, or if the pain in any area continues to increase, you should return to the Emergency Department for re-evaluation.  HOME CARE INSTRUCTIONS  Routine care for sore areas should include:  Ice to sore areas every 2  hours for 20 minutes while awake for the next 2 days.  Drink extra fluids (not alcohol).  Take a hot or warm shower or bath once or twice a day to increase blood flow to sore muscles. This will help you "limber up".  Activity as tolerated. Lifting may aggravate neck or back pain.  Only take over-the-counter or prescription medicines for pain, discomfort, or fever as  directed by your caregiver. Do not use aspirin. This may increase bruising or increase bleeding if there are small areas where this is happening. SEEK IMMEDIATE MEDICAL CARE IF:  Numbness, tingling, weakness, or problem with the use of your arms or legs.  A severe headache is not relieved with medications.  There is a change in bowel or bladder control.  Increasing pain in any areas of the body.  Short of breath or dizzy.  Nauseated, vomiting, or sweating.  Increasing belly (abdominal) discomfort.  Blood in urine, stool, or vomiting blood.  Pain in either shoulder in an area where a shoulder strap would be.  Feelings of lightheadedness or if you have a fainting episode. Sometimes it is not possible to identify all injuries immediately after the trauma. It is important that you continue to monitor your condition after the emergency department visit. If you feel you are not improving, or improving more slowly than should be expected, call your physician. If you feel your symptoms (problems) are worsening, return to the Emergency Department immediately. Document Released: 05/02/2001 Document Revised: 10/29/2011 Document Reviewed: 03/24/2008 [facility] Patient Information 2014 [place], [place].

## 2014-01-11 NOTE — ED Provider Notes (Signed)
CSN: [id number]     Arrival date & time 01/10/14  2130 History   First MD Initiated Contact with Patient 01/10/14 2338     Chief Complaint  Patient presents with  . Facial Injury     (Consider location/radiation/quality/duration/timing/severity/associated sxs/prior Treatment) HPI Comments: Patient hit with an elbow in the nose during a kickball game. C/o of severe nasal pain , swelling , and difficulty breathing through the nose. She denies visual disturbance, Eye pain, headache. LOC. Epistaxis occurred immediately but has resolved  Patient is a 38 y.o. female presenting with facial injury. The history is provided by the patient. No language interpreter was used.  Facial Injury Mechanism of injury:  Direct blow Location:  Nose Time since incident: just pta. Pain details:    Quality:  Throbbing (swollen)   Severity:  Severe   Timing:  Constant Chronicity:  New Foreign body present:  No foreign bodies Relieved by:  None tried Worsened by:  Nothing tried Ineffective treatments:  None tried Associated symptoms: congestion, difficulty breathing and epistaxis   Associated symptoms: no altered mental status, no double vision, no ear pain, no headaches, no loss of consciousness, no malocclusion, no nausea, no neck pain, no rhinorrhea, no trismus, no vomiting and no wheezing   Risk factors: trauma     Past Medical History  Diagnosis Date  . Chlamydia   . QJJHERDE(081.4)    Past Surgical History  Procedure Laterality Date  . Tubal ligation    . Tonsillectomy    . Carpel tunnel release     Family History  Problem Relation Age of Onset  . Asthma Mother   . Hypertension Mother   . Diabetes Maternal Aunt   . Hypertension Maternal Grandmother    History  Substance Use Topics  . Smoking status: Never Smoker   . Smokeless tobacco: Never Used  . Alcohol Use: No   OB History   Grav Para Term Preterm Abortions TAB SAB Ect Mult Living   4 3 3  0 1 0 1 0 0 3     Review of  Systems  Constitutional: Positive for appetite change.  HENT: Positive for congestion and nosebleeds. Negative for ear pain and rhinorrhea.   Eyes: Negative for double vision, photophobia, pain, redness and visual disturbance.  Respiratory: Negative for wheezing.   Gastrointestinal: Negative for nausea and vomiting.  Musculoskeletal: Negative for neck pain.  Skin: Positive for rash.  Neurological: Negative for dizziness, loss of consciousness, syncope and headaches.  Psychiatric/Behavioral: Negative for confusion.      Allergies  Review of patient's allergies indicates no known allergies.  Home Medications   Prior to Admission medications   Medication Sig Start Date End Date Taking? Authorizing Provider  etonogestrel-ethinyl estradiol (NUVARING) 0.12-0.015 MG/24HR vaginal ring Place 1 each vaginally every 28 (twenty-eight) days. Insert vaginally and leave in place for 3 consecutive weeks, then remove for 1 week.   Yes Historical Provider, MD  mupirocin ointment (BACTROBAN) 2 % Place 1 application into the nose 3 (three) times daily. For ringworm treatment   Yes Historical Provider, MD  triamcinolone cream (KENALOG) 0.1 % Apply 1 application topically 3 (three) times daily. For ringworm treatment   Yes Historical Provider, MD  zolpidem (AMBIEN) 10 MG tablet Take 10 mg by mouth at bedtime as needed for sleep.   Yes Historical Provider, MD   BP 126/82  Pulse 90  Temp(Src) 98.3 F (36.8 C) (Oral)  Resp 16  SpO2 99%  LMP 12/23/2013 Physical Exam  Constitutional:  She is oriented to person, place, and time. She appears well-developed and well-nourished. No distress.  HENT:  Head: Normocephalic and atraumatic.  Nose: Mucosal edema and sinus tenderness present. No rhinorrhea, nose lacerations, nasal deformity, septal deviation or nasal septal hematoma. Epistaxis is observed.  No foreign bodies.  Eyes: Conjunctivae are normal. No scleral icterus.  Neck: Normal range of motion.   Cardiovascular: Normal rate, regular rhythm and normal heart sounds.  Exam reveals no gallop and no friction rub.   No murmur heard. Pulmonary/Chest: Effort normal and breath sounds normal. No respiratory distress.  Abdominal: Soft. Bowel sounds are normal. She exhibits no distension and no mass. There is no tenderness. There is no guarding.  Neurological: She is alert and oriented to person, place, and time.  Skin: Skin is warm and dry. She is not diaphoretic.    ED Course  Procedures (including critical care time) Labs Review Labs Reviewed - No data to display  Imaging Review Ct Maxillofacial Wo Cm  01/11/2014   CLINICAL DATA:  Facial injury, struck in nose by an elbow causing bleeding  EXAM: CT MAXILLOFACIAL WITHOUT CONTRAST  TECHNIQUE: Multidetector CT imaging of the maxillofacial structures was performed. Multiplanar CT image reconstructions were also generated. A small metallic BB was placed on the right temple in order to reliably differentiate right from left.  COMPARISON:  None.  FINDINGS: Paranasal sinuses, mastoid air cells and middle ear cavities clear.  Beam hardening artifacts of dental origin.  Visualized intracranial structures unremarkable.  Intraorbital and facial soft tissue planes otherwise normal appearance.  Visualized portion of cervical spine is normal in appearance.  No nasal bone or facial bone fractures identified.  IMPRESSION: No acute facial bone abnormalities.   Electronically Signed   By: Ulyses SouthwardMark  Boles M.D.   On: 01/11/2014 02:16     EKG Interpretation None      MDM   Final diagnoses:  Nasal injury  Epistaxis    Patient with injury to the face . No signs of trauma to the eye. A&O. Ct negative. sxs relief with pain meds and afrin. F/u with ENT for unresolved sxs. RICE. The patient appears reasonably screened and/or stabilized for discharge and I doubt any other medical condition or other [facility] requiring further screening, evaluation, or treatment in the  ED at this time prior to discharge.     Arthor Captainbigail Carol Theys, PA-C 01/11/14 2013

## 2014-01-13 NOTE — ED Provider Notes (Signed)
Medical screening examination/treatment/procedure(s) were performed by non-physician practitioner and as supervising physician I was immediately available for consultation/collaboration.   EKG Interpretation None        Diyana Starrett, MD 01/13/14 1856 

## 2014-01-29 ENCOUNTER — Inpatient Hospital Stay (HOSPITAL_COMMUNITY)
Admission: AD | Admit: 2014-01-29 | Discharge: 2014-02-03 | DRG: 863 | Disposition: A | Discharge status: Home / Self Care | Payer: Medicaid Other | Source: Ambulatory Visit | Attending: Otolaryngology | Admitting: Otolaryngology

## 2014-01-29 ENCOUNTER — Other Ambulatory Visit: Payer: Self-pay | Admitting: Otolaryngology

## 2014-01-29 ENCOUNTER — Encounter (HOSPITAL_COMMUNITY): Payer: Self-pay | Admitting: *Deleted

## 2014-01-29 DIAGNOSIS — Y849 Medical procedure, unspecified as the cause of abnormal reaction of the patient, or of later complication, without mention of misadventure at the time of the procedure: Secondary | ICD-10-CM | POA: Diagnosis present

## 2014-01-29 DIAGNOSIS — J342 Deviated nasal septum: Secondary | ICD-10-CM | POA: Diagnosis present

## 2014-01-29 DIAGNOSIS — A4901 Methicillin susceptible Staphylococcus aureus infection, unspecified site: Secondary | ICD-10-CM | POA: Diagnosis present

## 2014-01-29 DIAGNOSIS — J3489 Other specified disorders of nose and nasal sinuses: Secondary | ICD-10-CM | POA: Diagnosis present

## 2014-01-29 DIAGNOSIS — IMO0002 Reserved for concepts with insufficient information to code with codable children: Secondary | ICD-10-CM | POA: Diagnosis present

## 2014-01-29 DIAGNOSIS — J340 Abscess, furuncle and carbuncle of nose: Secondary | ICD-10-CM | POA: Diagnosis present

## 2014-01-29 DIAGNOSIS — T8140XA Infection following a procedure, unspecified, initial encounter: Principal | ICD-10-CM | POA: Diagnosis present

## 2014-01-29 LAB — BASIC METABOLIC PANEL
BUN: 13 mg/dL (ref 6–23)
CHLORIDE: 102 meq/L (ref 96–112)
CO2: 22 meq/L (ref 19–32)
CREATININE: 0.86 mg/dL (ref 0.50–1.10)
Calcium: 9.5 mg/dL (ref 8.4–10.5)
GFR calc Af Amer: >90 mL/min (ref >90)
GFR calc non Af Amer: 86 mL/min — ABNORMAL LOW (ref >90)
Glucose, Bld: 104 mg/dL — ABNORMAL HIGH (ref 70–99)
Potassium: 4 mEq/L (ref 3.7–5.3)
Sodium: 140 mEq/L (ref 137–147)

## 2014-01-29 LAB — CBC WITH DIFFERENTIAL/PLATELET
BASOS ABS: 0 10*3/uL (ref 0.0–0.1)
Basophils Relative: 0 % (ref 0–1)
EOS PCT: 4 % (ref 0–5)
Eosinophils Absolute: 0.2 10*3/uL (ref 0.0–0.7)
HEMATOCRIT: 40.6 % (ref 36.0–46.0)
Hemoglobin: 13.4 g/dL (ref 12.0–15.0)
Lymphocytes Relative: 46 % (ref 12–46)
Lymphs Abs: 1.9 10*3/uL (ref 0.7–4.0)
MCH: 29.3 pg (ref 26.0–34.0)
MCHC: 33 g/dL (ref 30.0–36.0)
MCV: 88.8 fL (ref 78.0–100.0)
MONOS PCT: 6 % (ref 3–12)
Monocytes Absolute: 0.3 10*3/uL (ref 0.1–1.0)
Neutro Abs: 1.9 10*3/uL (ref 1.7–7.7)
Neutrophils Relative %: 44 % (ref 43–77)
Platelets: 332 10*3/uL (ref 150–400)
RBC: 4.57 MIL/uL (ref 3.87–5.11)
RDW: 12.7 % (ref 11.5–15.5)
WBC: 4.2 10*3/uL (ref 4.0–10.5)

## 2014-01-29 LAB — SURGICAL PCR SCREEN
MRSA, PCR: NEGATIVE
Staphylococcus aureus: NEGATIVE

## 2014-01-29 MED ORDER — MORPHINE SULFATE 2 MG/ML IJ SOLN: 1.0000 mg | INTRAMUSCULAR | Status: DC | PRN

## 2014-01-29 MED ORDER — PIPERACILLIN-TAZOBACTAM 3.375 G IVPB
3.3750 g | Freq: Three times a day (TID) | INTRAVENOUS | Status: DC
Start: 2014-01-29 — End: 2014-02-03
  Administered 2014-01-29 – 2014-02-03 (×14): 3.375 g via INTRAVENOUS
  Filled 2014-01-29 (×16): qty 50

## 2014-01-29 MED ORDER — OXYCODONE HCL 5 MG PO TABS
5.0000 mg | ORAL_TABLET | ORAL | Status: DC | PRN
  Administered 2014-01-29 (×2): 10 mg via ORAL
  Filled 2014-01-29: qty 2
  Filled 2014-01-29 (×2): qty 1

## 2014-01-29 MED ORDER — OXYMETAZOLINE HCL 0.05 % NA SOLN
2.0000 | NASAL | Status: AC
  Administered 2014-01-30 (×2): 2 via NASAL
  Filled 2014-01-29: qty 15

## 2014-01-29 MED ORDER — VANCOMYCIN HCL 10 G IV SOLR
1500.0000 mg | Freq: Once | INTRAVENOUS | Status: AC
  Administered 2014-01-29: 1500 mg via INTRAVENOUS
  Filled 2014-01-29: qty 1500

## 2014-01-29 MED ORDER — VANCOMYCIN HCL IN DEXTROSE 1-5 GM/200ML-% IV SOLN
1000.0000 mg | Freq: Three times a day (TID) | INTRAVENOUS | Status: DC
  Administered 2014-01-30 – 2014-02-03 (×14): 1000 mg via INTRAVENOUS
  Filled 2014-01-29 (×15): qty 200

## 2014-01-29 NOTE — Progress Notes (Addendum)
ANTIBIOTIC CONSULT NOTE - INITIAL  Pharmacy Consult for vancomycin and Zosyn Indication: posibble nasal abscess  No Known Allergies  Patient Measurements: Height: 5' 8.11" (173 cm) Weight: 213 lb 10 oz (96.9 kg) IBW/kg (Calculated) : 64.15  Vital Signs: Temp: 97.6 F (36.4 C) (06/12 1730) Temp src: Oral (06/12 1730) BP: 127/74 mmHg (06/12 1730) Pulse Rate: 90 (06/12 1730) Intake/Output from previous day:   Intake/Output from this shift:    Labs: No results found for this basename: WBC, HGB, PLT, LABCREA, CREATININE,  in the last 72 hours Estimated Creatinine Clearance: 79.1 ml/min (by C-G formula based on Cr of 1.2). No results found for this basename: VANCOTROUGH, VANCOPEAK, VANCORANDOM, GENTTROUGH, GENTPEAK, GENTRANDOM, TOBRATROUGH, TOBRAPEAK, TOBRARND, AMIKACINPEAK, AMIKACINTROU, AMIKACIN,  in the last 72 hours   Microbiology: No results found for this or any previous visit (from the past 720 hour(s)).  Medical History: Past Medical History  Diagnosis Date  . Chlamydia   . Headache(784.0)     Medications:  Prescriptions prior to admission  Medication Sig Dispense Refill  . etonogestrel-ethinyl estradiol (NUVARING) 0.12-0.015 MG/24HR vaginal ring Place 1 each vaginally every 28 (twenty-eight) days. Insert vaginally and leave in place for 3 consecutive weeks, then remove for 1 week.      Marland Kitchen. HYDROcodone-acetaminophen (NORCO) 5-325 MG per tablet Take 1-2 tablets by mouth every 6 (six) hours as needed for moderate pain.  20 tablet  0  . mupirocin ointment (BACTROBAN) 2 % Place 1 application into the nose 3 (three) times daily. For ringworm treatment      . triamcinolone cream (KENALOG) 0.1 % Apply 1 application topically 3 (three) times daily. For ringworm treatment      . zolpidem (AMBIEN) 10 MG tablet Take 10 mg by mouth at bedtime as needed for sleep.       Assessment: 38 y/o female who was hit in the nose while playing kickball in late May. She returned to the ENT  office with worsening nasal congestion and was prescribed Keflex and Bactrim DS which did not resolve symptoms. She had an office I&D of left septal hematoma and was given clindamycin. Her symptoms returned and she is now admitted for repeat I&D. Pharmacy consulted to begin vancomycin and Zosyn. BMET has been drawn, no other labs available.  Goal of Therapy:  Vancomycin trough level 15-20 mcg/ml Eradication of infection  Plan:  - Vancomycin 1500 mg IV now - Zosyn 3.375 g IV q8h - Will follow-up BMET and give vancomycin maintenance dose  [facility]iesJennifer [place], [place]Pharm.D., BCPS Clinical Pharmacist Pager: [phone] 01/29/2014 6:02 PM   Addendum: Renal function is normal with a SCr of 0.86 and Est CrCl >90 ml/min. WBC are normal.  Vancomycin 1000 mg IV q8h Monitor renal function and clinical progress Trough as indicated  [facility] HospitalJennifer , 17[address] Rainbow BoulevardPharm.D., BCPS Clinical Pharmacist Pager: [phone] 01/29/2014 8:52 PM

## 2014-01-29 NOTE — Progress Notes (Signed)
Pt in house.  Awaiting lab draws, and IV for meds.  Will continue with plan for I&D nasal septal abscess in AM.  Flo ShanksKarol Sarthak Rubenstein MD

## 2014-01-29 NOTE — H&P (Signed)
Jasmine Gentry,  Taia 38 y.o., female [id number]     Chief Complaint: nose pain  HPI: Patient is a 38 year old female who presents for new nasal obstruction. Five days ago she was playing kickball and was struck in the nose by an elbow or knee. There was some bleeding from the nose. Maxillofacial CT did not reveal any nasal or facial fractures. Since the incident, she has felt increasing pain in the nose along the bridge and tip. She is congested particularly on her left side. No antibiotics or other medical intervention at this point.  Patient is a 38 year old female who presents for recheck of her nose. Eight days ago she was playing kickball and was hit in the nose by another player's elbow or knee. There was bleeding which stopped without intervention. She reported new nasal obstruction and nasal pain five days out from the injury. Initial CT did not reveal any facial or nasal fractures. Exam showed mild swelling of the left nasal septum but no frank hematoma or abscess. The right nasal septum appeared normal. Repeat CT was not significantly different from initial scan. She was prescribed Keflex and returns today. The nasal pain is stable, no better or worse. She did use some ice on it over the weekend. No fever.  Patient is a 38 year old female who presents for worsening nasal congestion following nasal injurty twelve days ago. She was hit in the nose while playing kickball. CT did not reveal any obvious fracture. She has swelling of nasal septum on the left side only but no evidence of hematoma or abscess. Repeat CT scan eight days out from the injury did not show any changes; no fluid collection within the septum. She was prescribed Keflex and at last visit we added Bactrim. She continues to have nasal pain but this has not worsened. Her congestion on the left side has worsening affecting her sleep. No PMH of asthma, OSA, bleeding disorder or adverse reaction to anesthesia.  Patient is a 38 year old  female who presents for worsening nasal pain and nasal obstruction. Seen initially 01/15/14, five days following nasal injury while playing kickball. CT showed no bony fractures. On exam, the left septal wall was slightly swollen. She was started on Keflex. She returned the following week and symptoms were not improved. CT imaging repeated which showed no obvious fluid accumulation in the septum. Started on Bactrim DS. She returned later that week without any improvement. It was decided to attempt I&D in the office. There was a blood clot that was removed and Merocel packing placed. She could not tolerate a quilt stitch through the septum. No culture obtained. She following up for pack removal five days ago and was feeling much better. Packing removed. Started on Clindamycin. She states that later that evening, she began to feel congested again on the left side. Pain mainly inside the nose. She felt febrile one day. Mainly she is tired from not sleeping well.  She denies a history of diabetes, autoimmune disorder, immunocompromised state or HIV. Has not been on steroids recently. She denies a history of sleep apnea, asthma, bleeding disorder or adverse reaction to anesthesia. She does not use tobacco.    PMH: Past Medical History  Diagnosis Date  . Chlamydia   . Headache(784.0)     Surg Hx: Past Surgical History  Procedure Laterality Date  . Tubal ligation    . Tonsillectomy    . Carpel tunnel release      FHx:  Family History  Problem Relation Age of Onset  . Asthma Mother   . Hypertension Mother   . Diabetes Maternal Aunt   . Hypertension Maternal Grandmother    SocHx:  reports that she has never smoked. She has never used smokeless tobacco. She reports that she does not drink alcohol or use illicit drugs.  ALLERGIES: No Known Allergies   (Not in a hospital admission)  No results found for this or any previous visit (from the past 48 hour(s)). No results found.    Last  menstrual period 12/23/2013.  BP:126/74,  Height: 5 ft 8 in, Weight: 213 lb , BMI: 32.4 kg/m2,   PHYSICAL EXAM: APPEARANCE: Well developed, overweight, in no acute distress.  Normal affect, in a pleasant mood.  Oriented to time, place and person. COMMUNICATION: Normal voice   HEAD & FACE:  No scars, lesions or masses of head and face.  Sinuses nontender to palpation.  Salivary glands without mass or tenderness.  Facial strength symmetric.   EYES: EOMI with normal primary gaze alignment. Visual acuity grossly intact.  PERRLA EXTERNAL EAR & NOSE: No scars, lesions or masses  EAC & TYMPANIC MEMBRANE:  EAC shows no obstructing lesions or debris and tympanic membranes are normal bilaterally. GROSS HEARING: Normal   TMJ:  Nontender  INTRANASAL EXAM: Left nasal septum is swollen, there is a crusting anteriorly where the previous I&D incision was made. No polyps or purulence.  NASOPHARYNX: Normal, without lesions. LIPS, TEETH & GUMS: No lip lesions, normal dentition and normal gums. ORAL CAVITY/OROPHARYNX:  Oral mucosa moist without lesion or asymmetry of the palate, tongue, tonsil or posterior pharynx. NECK:  Supple without adenopathy or mass. THYROID:  Normal with no masses palpable.  NEUROLOGIC:  No gross CN deficits. No nystagmus noted.   LYMPHATIC:  No enlarged nodes palpable. CARDIOVASCULAR: regular rate and rhythm.  PULMONARY: Clear to auscultation bilaterally.  Studies Reviewed:  CT sinuses    Assessment/Plan Nasal pain (478.19) (J34.89). Nasal septal abscess (478.19) (J34.0).  Five days status post I&D of left septal hematoma with packing removal. Her symptoms have returned. It seems that the problem has returned. This may represent an abscess. She is currently taking Clindamycin. Recommend admission to the hospital with plan to drain this tomorrow morning. Nothing more to eat or drink. Paperwork completed and patient instructed where to go for admission. She agrees with the plan  and will follow up accordingly here in the office.  Clindamycin HCl - 300 MG Oral Capsule;TAKE 1 CAPSULE 3 TIMES DAILY; Rx Hydrocodone-Acetaminophen 7.5-325 MG Oral Tablet;TAKE 1 TABLET EVERY 4 TO 6 HOURS AS NEEDED FOR PAIN.; Rx.  Deshannon Seide 01/29/2014, 5:49 PM     

## 2014-01-30 ENCOUNTER — Encounter (HOSPITAL_COMMUNITY): Payer: Medicaid Other | Admitting: Anesthesiology

## 2014-01-30 ENCOUNTER — Inpatient Hospital Stay: Admit: 2014-01-30 | Payer: Self-pay | Admitting: Otolaryngology

## 2014-01-30 ENCOUNTER — Encounter (HOSPITAL_COMMUNITY): Payer: Self-pay | Admitting: Anesthesiology

## 2014-01-30 ENCOUNTER — Inpatient Hospital Stay (HOSPITAL_COMMUNITY): Payer: Medicaid Other | Admitting: Anesthesiology

## 2014-01-30 ENCOUNTER — Encounter (HOSPITAL_COMMUNITY)
Admission: AD | Disposition: A | Discharge status: Home / Self Care | Payer: Self-pay | Source: Ambulatory Visit | Attending: Otolaryngology

## 2014-01-30 DIAGNOSIS — J340 Abscess, furuncle and carbuncle of nose: Secondary | ICD-10-CM | POA: Diagnosis present

## 2014-01-30 HISTORY — PX: NASAL SINUS SURGERY: SHX719

## 2014-01-30 SURGERY — SINUS SURGERY, ENDOSCOPIC
Anesthesia: General | Site: Nose

## 2014-01-30 MED ORDER — OXYMETAZOLINE HCL 0.05 % NA SOLN
NASAL | Status: AC
  Filled 2014-01-30: qty 15

## 2014-01-30 MED ORDER — PROMETHAZINE HCL 25 MG/ML IJ SOLN: 6.2500 mg | INTRAMUSCULAR | Status: DC | PRN

## 2014-01-30 MED ORDER — PROPOFOL 10 MG/ML IV BOLUS
INTRAVENOUS | Status: AC
  Filled 2014-01-30: qty 20

## 2014-01-30 MED ORDER — MIDAZOLAM HCL 5 MG/5ML IJ SOLN
INTRAMUSCULAR | Status: DC | PRN
  Administered 2014-01-30: 2 mg via INTRAVENOUS

## 2014-01-30 MED ORDER — ONDANSETRON HCL 4 MG PO TABS: 4.0000 mg | ORAL_TABLET | ORAL | Status: DC | PRN

## 2014-01-30 MED ORDER — OXYCODONE HCL 5 MG PO TABS
ORAL_TABLET | ORAL | Status: AC
  Administered 2014-01-30: 5 mg via ORAL
  Filled 2014-01-30: qty 1

## 2014-01-30 MED ORDER — FENTANYL CITRATE 0.05 MG/ML IJ SOLN
INTRAMUSCULAR | Status: AC
  Filled 2014-01-30: qty 2

## 2014-01-30 MED ORDER — OXYCODONE HCL 5 MG PO TABS
5.0000 mg | ORAL_TABLET | ORAL | Status: DC | PRN
  Administered 2014-01-30 (×2): 10 mg via ORAL
  Administered 2014-01-30: 5 mg via ORAL
  Administered 2014-01-31 – 2014-02-03 (×13): 10 mg via ORAL
  Filled 2014-01-30 (×16): qty 2

## 2014-01-30 MED ORDER — LACTATED RINGERS IV SOLN
INTRAVENOUS | Status: DC | PRN
  Administered 2014-01-30: 07:00:00 via INTRAVENOUS

## 2014-01-30 MED ORDER — GLYCOPYRROLATE 0.2 MG/ML IJ SOLN
INTRAMUSCULAR | Status: DC | PRN
  Administered 2014-01-30: 0.4 mg via INTRAVENOUS

## 2014-01-30 MED ORDER — NEOMYCIN-POLYMYXIN-HC 1 % OT SOLN
3.0000 [drp] | OTIC | Status: DC
  Filled 2014-01-30: qty 10

## 2014-01-30 MED ORDER — ACETAMINOPHEN 325 MG PO TABS
325.0000 mg | ORAL_TABLET | ORAL | Status: DC | PRN
  Administered 2014-01-30 (×2): 650 mg via ORAL
  Filled 2014-01-30: qty 2

## 2014-01-30 MED ORDER — SODIUM CHLORIDE 0.9 % IR SOLN
Status: DC | PRN
  Administered 2014-01-30: 1000 mL

## 2014-01-30 MED ORDER — FENTANYL CITRATE 0.05 MG/ML IJ SOLN
INTRAMUSCULAR | Status: AC
  Filled 2014-01-30: qty 5

## 2014-01-30 MED ORDER — DEXTROSE-NACL 5-0.45 % IV SOLN
INTRAVENOUS | Status: DC
  Administered 2014-01-30: 11:00:00 via INTRAVENOUS

## 2014-01-30 MED ORDER — ONDANSETRON HCL 4 MG/2ML IJ SOLN
4.0000 mg | INTRAMUSCULAR | Status: DC | PRN
  Administered 2014-02-03: 4 mg via INTRAVENOUS
  Filled 2014-01-30: qty 2

## 2014-01-30 MED ORDER — SUCCINYLCHOLINE CHLORIDE 20 MG/ML IJ SOLN
INTRAMUSCULAR | Status: DC | PRN
  Administered 2014-01-30: 50 mg via INTRAVENOUS

## 2014-01-30 MED ORDER — OXYCODONE HCL 5 MG/5ML PO SOLN: 5.0000 mg | Freq: Once | ORAL | Status: AC | PRN

## 2014-01-30 MED ORDER — FENTANYL CITRATE 0.05 MG/ML IJ SOLN
INTRAMUSCULAR | Status: DC | PRN
  Administered 2014-01-30 (×2): 100 ug via INTRAVENOUS
  Administered 2014-01-30: 50 ug via INTRAVENOUS

## 2014-01-30 MED ORDER — NEOMYCIN-COLIST-HC-THONZONIUM 3.3-3-10-0.5 MG/ML OT SUSP
OTIC | Status: DC | PRN
  Administered 2014-01-30: 4 [drp]

## 2014-01-30 MED ORDER — ROCURONIUM BROMIDE 100 MG/10ML IV SOLN
INTRAVENOUS | Status: DC | PRN
  Administered 2014-01-30: 20 mg via INTRAVENOUS

## 2014-01-30 MED ORDER — FENTANYL CITRATE 0.05 MG/ML IJ SOLN
25.0000 ug | INTRAMUSCULAR | Status: DC | PRN
  Administered 2014-01-30 (×2): 50 ug via INTRAVENOUS

## 2014-01-30 MED ORDER — HYDROMORPHONE HCL PF 1 MG/ML IJ SOLN
0.2500 mg | INTRAMUSCULAR | Status: DC | PRN
Start: 2014-01-30 — End: 2014-02-03
  Administered 2014-01-30 (×4): 0.5 mg via INTRAVENOUS

## 2014-01-30 MED ORDER — BACIT-POLY-NEO HC 1 % EX OINT
TOPICAL_OINTMENT | CUTANEOUS | Status: AC
  Filled 2014-01-30: qty 15

## 2014-01-30 MED ORDER — HYDROMORPHONE HCL PF 1 MG/ML IJ SOLN
INTRAMUSCULAR | Status: AC
  Administered 2014-01-30: 0.5 mg via INTRAVENOUS
  Filled 2014-01-30: qty 1

## 2014-01-30 MED ORDER — OXYCODONE HCL 5 MG PO TABS
5.0000 mg | ORAL_TABLET | Freq: Once | ORAL | Status: AC | PRN
  Administered 2014-01-30: 5 mg via ORAL

## 2014-01-30 MED ORDER — OXYMETAZOLINE HCL 0.05 % NA SOLN
NASAL | Status: DC | PRN
  Administered 2014-01-30: 1 via NASAL

## 2014-01-30 MED ORDER — ACETAMINOPHEN 325 MG PO TABS
ORAL_TABLET | ORAL | Status: AC
  Administered 2014-01-30: 650 mg via ORAL
  Filled 2014-01-30: qty 2

## 2014-01-30 MED ORDER — ACETAMINOPHEN 160 MG/5ML PO SOLN: 325.0000 mg | ORAL | Status: DC | PRN

## 2014-01-30 MED ORDER — MIDAZOLAM HCL 2 MG/2ML IJ SOLN
INTRAMUSCULAR | Status: AC
  Filled 2014-01-30: qty 2

## 2014-01-30 MED ORDER — PROPOFOL 10 MG/ML IV BOLUS
INTRAVENOUS | Status: DC | PRN
  Administered 2014-01-30: 20 mg via INTRAVENOUS
  Administered 2014-01-30: 160 mg via INTRAVENOUS

## 2014-01-30 MED ORDER — DEXAMETHASONE SODIUM PHOSPHATE 4 MG/ML IJ SOLN
INTRAMUSCULAR | Status: DC | PRN
  Administered 2014-01-30: 8 mg via INTRAVENOUS

## 2014-01-30 MED ORDER — OXYCODONE HCL 5 MG PO TABS
ORAL_TABLET | ORAL | Status: AC
  Administered 2014-01-30: 10 mg via ORAL
  Filled 2014-01-30: qty 1

## 2014-01-30 MED ORDER — BACITRACIN-NEOMYCIN-POLYMYXIN 400-5-5000 EX OINT
TOPICAL_OINTMENT | CUTANEOUS | Status: AC
  Filled 2014-01-30: qty 1

## 2014-01-30 MED ORDER — LIDOCAINE HCL (CARDIAC) 20 MG/ML IV SOLN
INTRAVENOUS | Status: DC | PRN
  Administered 2014-01-30: 80 mg via INTRAVENOUS

## 2014-01-30 MED ORDER — LIDOCAINE-EPINEPHRINE 1 %-1:100000 IJ SOLN
INTRAMUSCULAR | Status: DC | PRN
  Administered 2014-01-30: 3 mL

## 2014-01-30 MED ORDER — MORPHINE SULFATE 2 MG/ML IJ SOLN
1.0000 mg | INTRAMUSCULAR | Status: DC | PRN
  Administered 2014-01-30 – 2014-02-01 (×7): 2 mg via INTRAVENOUS
  Administered 2014-02-02: 1 mg via INTRAVENOUS
  Administered 2014-02-02: 2 mg via INTRAVENOUS
  Administered 2014-02-03: 1 mg via INTRAVENOUS
  Administered 2014-02-03: 2 mg via INTRAVENOUS
  Filled 2014-01-30 (×11): qty 1

## 2014-01-30 MED ORDER — NEOSTIGMINE METHYLSULFATE 10 MG/10ML IV SOLN
INTRAVENOUS | Status: DC | PRN
  Administered 2014-01-30: 3 mg via INTRAVENOUS

## 2014-01-30 SURGICAL SUPPLY — 45 items
ATTRACTOMAT 16X20 MAGNETIC DRP (DRAPES) IMPLANT
BALL CTTN LRG ABS STRL LF (GAUZE/BANDAGES/DRESSINGS) ×1
BLADE 10 SAFETY STRL DISP (BLADE) ×2 IMPLANT
BLADE RAD40 ROTATE 4M 4 5PK (BLADE) IMPLANT
BLADE RAD60 ROTATE M4 4 5PK (BLADE) IMPLANT
BLADE TRICUT ROTATE M4 4 5PK (BLADE) IMPLANT
CANISTER SUCTION 2500CC (MISCELLANEOUS) ×4 IMPLANT
COAGULATOR SUCT SWTCH 10FR 6 (ELECTROSURGICAL) IMPLANT
COTTONBALL LRG STERILE PKG (GAUZE/BANDAGES/DRESSINGS) ×2 IMPLANT
CRADLE DONUT ADULT HEAD (MISCELLANEOUS) IMPLANT
DECANTER SPIKE VIAL GLASS SM (MISCELLANEOUS) ×2 IMPLANT
DRESSING NASAL POPE 10X1.5X2.5 (GAUZE/BANDAGES/DRESSINGS) IMPLANT
DRESSING TELFA 8X3 (GAUZE/BANDAGES/DRESSINGS) ×2 IMPLANT
DRSG NASAL POPE 10X1.5X2.5 (GAUZE/BANDAGES/DRESSINGS) ×2
ELECT REM PT RETURN 9FT ADLT (ELECTROSURGICAL)
ELECTRODE REM PT RTRN 9FT ADLT (ELECTROSURGICAL) IMPLANT
FILTER ARTHROSCOPY CONVERTOR (FILTER) ×2 IMPLANT
GAUZE PACKING FOLDED 2  STR (GAUZE/BANDAGES/DRESSINGS) ×1
GAUZE PACKING FOLDED 2 STR (GAUZE/BANDAGES/DRESSINGS) ×1 IMPLANT
GAUZE PACKING IODOFORM 1 (PACKING) ×1 IMPLANT
GLOVE ECLIPSE 8.0 STRL XLNG CF (GLOVE) ×2 IMPLANT
GOWN STRL REUS W/ TWL LRG LVL3 (GOWN DISPOSABLE) ×1 IMPLANT
GOWN STRL REUS W/ TWL XL LVL3 (GOWN DISPOSABLE) ×1 IMPLANT
GOWN STRL REUS W/TWL LRG LVL3 (GOWN DISPOSABLE) ×2
GOWN STRL REUS W/TWL XL LVL3 (GOWN DISPOSABLE) ×2
KIT BASIN OR (CUSTOM PROCEDURE TRAY) ×2 IMPLANT
KIT ROOM TURNOVER OR (KITS) ×2 IMPLANT
NDL SPNL 25GX3.5 QUINCKE BL (NEEDLE) ×1 IMPLANT
NEEDLE SPNL 25GX3.5 QUINCKE BL (NEEDLE) ×2 IMPLANT
NS IRRIG 1000ML POUR BTL (IV SOLUTION) ×2 IMPLANT
PAD ARMBOARD 7.5X6 YLW CONV (MISCELLANEOUS) ×4 IMPLANT
PATTIES SURGICAL .5 X3 (DISPOSABLE) ×2 IMPLANT
PENCIL BUTTON HOLSTER BLD 10FT (ELECTRODE) IMPLANT
SHEATH ENDOSCRUB 0 DEG (SHEATH) ×2 IMPLANT
SHEATH ENDOSCRUB 30 DEG (SHEATH) IMPLANT
SHEATH ENDOSCRUB 45 DEG (SHEATH) IMPLANT
SOLUTION ANTI FOG 6CC (MISCELLANEOUS) ×2 IMPLANT
SPECIMEN JAR SMALL (MISCELLANEOUS) ×2 IMPLANT
SPONGE GAUZE 4X4 12PLY STER LF (GAUZE/BANDAGES/DRESSINGS) ×1 IMPLANT
SUT SILK 2 0 FS (SUTURE) ×1 IMPLANT
TOWEL OR 17X24 6PK STRL BLUE (TOWEL DISPOSABLE) ×2 IMPLANT
TOWEL OR 17X26 10 PK STRL BLUE (TOWEL DISPOSABLE) ×2 IMPLANT
TRAY ENT MC OR (CUSTOM PROCEDURE TRAY) ×2 IMPLANT
TUBE CONNECTING 12X1/4 (SUCTIONS) ×4 IMPLANT
WATER STERILE IRR 1000ML POUR (IV SOLUTION) ×2 IMPLANT

## 2014-01-30 NOTE — Anesthesia Postprocedure Evaluation (Signed)
  Anesthesia Post-op Note  Patient: Jasmine Gentry  Procedure(s) Performed: Procedure(s): IRRIGATION AND DEBRIDEMENT NASAL SEPTAL ABSCESS (N/A)  Patient Location: PACU  Anesthesia Type:General  Level of Consciousness: awake  Airway and Oxygen Therapy: Patient Spontanous Breathing  Post-op Pain: mild  Post-op Assessment: Post-op Vital signs reviewed, Patient's Cardiovascular Status Stable, Respiratory Function Stable, Patent Airway, No signs of Nausea or vomiting and Pain level controlled  Post-op Vital Signs: Reviewed and stable  Last Vitals:  Filed Vitals:   01/30/14 1045  BP: 136/84  Pulse: 83  Temp: 36.3 C  Resp: 17    Complications: No apparent anesthesia complications

## 2014-01-30 NOTE — Progress Notes (Signed)
Ice pack given to pt for nasal area as per order.

## 2014-01-30 NOTE — Transfer of Care (Signed)
Immediate Anesthesia Transfer of Care Note  Patient: Jasmine Gentry  Procedure(s) Performed: Procedure(s): IRRIGATION AND DEBRIDEMENT NASAL SEPTAL ABSCESS (N/A)  Patient Location: PACU  Anesthesia Type:General  Level of Consciousness: awake, alert , oriented and patient cooperative  Airway & Oxygen Therapy: Patient Spontanous Breathing and Patient connected to face mask oxygen  Post-op Assessment: Report given to PACU RN, Post -op Vital signs reviewed and stable and Patient moving all extremities X 4  Post vital signs: Reviewed and stable  Complications: No apparent anesthesia complications

## 2014-01-30 NOTE — Interval H&P Note (Signed)
History and Physical Interval Note:  01/30/2014 7:36 AM  Jasmine Gentry  has presented today for surgery, with the diagnosis of abscess  The various methods of treatment have been discussed with the patient and family. After consideration of risks, benefits and other options for treatment, the patient has consented to  Procedure(s): IRRIGATION AND DEBRIDEMENT NASAL SEPTAL ABSCESS (N/A) as a surgical intervention .  The patient's history has been re-reviewed, patient re-examined, no change in status, stable for surgery.  I have re-reviewed the patient's chart and labs.  Questions were answered to the patient's satisfaction.     Flo ShanksWOLICKI, Kenidi Elenbaas

## 2014-01-30 NOTE — Progress Notes (Signed)
Pt has had fentanyl, dilaudid, and p.o. Oxy IR. Sleeping most of the time; requesting additional pain meds when awake. I have explained that it would be unsafe to give more narcotics at this time. Pt verbalizes understanding and is asking to be taken back to her room.

## 2014-01-30 NOTE — H&P (View-Only) (Signed)
Jasmine Gentry,  Taia 38 y.o., female [id number]     Chief Complaint: nose pain  HPI: Patient is a 38 year old female who presents for new nasal obstruction. Five days ago she was playing kickball and was struck in the nose by an elbow or knee. There was some bleeding from the nose. Maxillofacial CT did not reveal any nasal or facial fractures. Since the incident, she has felt increasing pain in the nose along the bridge and tip. She is congested particularly on her left side. No antibiotics or other medical intervention at this point.  Patient is a 38 year old female who presents for recheck of her nose. Eight days ago she was playing kickball and was hit in the nose by another player's elbow or knee. There was bleeding which stopped without intervention. She reported new nasal obstruction and nasal pain five days out from the injury. Initial CT did not reveal any facial or nasal fractures. Exam showed mild swelling of the left nasal septum but no frank hematoma or abscess. The right nasal septum appeared normal. Repeat CT was not significantly different from initial scan. She was prescribed Keflex and returns today. The nasal pain is stable, no better or worse. She did use some ice on it over the weekend. No fever.  Patient is a 38 year old female who presents for worsening nasal congestion following nasal injurty twelve days ago. She was hit in the nose while playing kickball. CT did not reveal any obvious fracture. She has swelling of nasal septum on the left side only but no evidence of hematoma or abscess. Repeat CT scan eight days out from the injury did not show any changes; no fluid collection within the septum. She was prescribed Keflex and at last visit we added Bactrim. She continues to have nasal pain but this has not worsened. Her congestion on the left side has worsening affecting her sleep. No PMH of asthma, OSA, bleeding disorder or adverse reaction to anesthesia.  Patient is a 38 year old  female who presents for worsening nasal pain and nasal obstruction. Seen initially 01/15/14, five days following nasal injury while playing kickball. CT showed no bony fractures. On exam, the left septal wall was slightly swollen. She was started on Keflex. She returned the following week and symptoms were not improved. CT imaging repeated which showed no obvious fluid accumulation in the septum. Started on Bactrim DS. She returned later that week without any improvement. It was decided to attempt I&D in the office. There was a blood clot that was removed and Merocel packing placed. She could not tolerate a quilt stitch through the septum. No culture obtained. She following up for pack removal five days ago and was feeling much better. Packing removed. Started on Clindamycin. She states that later that evening, she began to feel congested again on the left side. Pain mainly inside the nose. She felt febrile one day. Mainly she is tired from not sleeping well.  She denies a history of diabetes, autoimmune disorder, immunocompromised state or HIV. Has not been on steroids recently. She denies a history of sleep apnea, asthma, bleeding disorder or adverse reaction to anesthesia. She does not use tobacco.    PMH: Past Medical History  Diagnosis Date  . Chlamydia   . Headache(784.0)     Surg Hx: Past Surgical History  Procedure Laterality Date  . Tubal ligation    . Tonsillectomy    . Carpel tunnel release      FHx:  Family History  Problem Relation Age of Onset  . Asthma Mother   . Hypertension Mother   . Diabetes Maternal Aunt   . Hypertension Maternal Grandmother    SocHx:  reports that she has never smoked. She has never used smokeless tobacco. She reports that she does not drink alcohol or use illicit drugs.  ALLERGIES: No Known Allergies   (Not in a hospital admission)  No results found for this or any previous visit (from the past 48 hour(s)). No results found.    Last  menstrual period 12/23/2013.  BP:126/74,  Height: 5 ft 8 in, Weight: 213 lb , BMI: 32.4 kg/m2,   PHYSICAL EXAM: APPEARANCE: Well developed, overweight, in no acute distress.  Normal affect, in a pleasant mood.  Oriented to time, place and person. COMMUNICATION: Normal voice   HEAD & FACE:  No scars, lesions or masses of head and face.  Sinuses nontender to palpation.  Salivary glands without mass or tenderness.  Facial strength symmetric.   EYES: EOMI with normal primary gaze alignment. Visual acuity grossly intact.  PERRLA EXTERNAL EAR & NOSE: No scars, lesions or masses  EAC & TYMPANIC MEMBRANE:  EAC shows no obstructing lesions or debris and tympanic membranes are normal bilaterally. GROSS HEARING: Normal   TMJ:  Nontender  INTRANASAL EXAM: Left nasal septum is swollen, there is a crusting anteriorly where the previous I&D incision was made. No polyps or purulence.  NASOPHARYNX: Normal, without lesions. LIPS, TEETH & GUMS: No lip lesions, normal dentition and normal gums. ORAL CAVITY/OROPHARYNX:  Oral mucosa moist without lesion or asymmetry of the palate, tongue, tonsil or posterior pharynx. NECK:  Supple without adenopathy or mass. THYROID:  Normal with no masses palpable.  NEUROLOGIC:  No gross CN deficits. No nystagmus noted.   LYMPHATIC:  No enlarged nodes palpable. CARDIOVASCULAR: regular rate and rhythm.  PULMONARY: Clear to auscultation bilaterally.  Studies Reviewed:  CT sinuses    Assessment/Plan Nasal pain (478.19) (J34.89). Nasal septal abscess (478.19) (J34.0).  Five days status post I&D of left septal hematoma with packing removal. Her symptoms have returned. It seems that the problem has returned. This may represent an abscess. She is currently taking Clindamycin. Recommend admission to the hospital with plan to drain this tomorrow morning. Nothing more to eat or drink. Paperwork completed and patient instructed where to go for admission. She agrees with the plan  and will follow up accordingly here in the office.  Clindamycin HCl - 300 MG Oral Capsule;TAKE 1 CAPSULE 3 TIMES DAILY; Rx Hydrocodone-Acetaminophen 7.5-325 MG Oral Tablet;TAKE 1 TABLET EVERY 4 TO 6 HOURS AS NEEDED FOR PAIN.; Rx.  Flo ShanksWOLICKI, Breyona Swander 01/29/2014, 5:49 PM

## 2014-01-30 NOTE — Op Note (Signed)
01/30/2014  8:54 AM    Jasmine Gentry, Jasmine Gentry  [id number]   Pre-Op Dx:  Nasal septal abscess  Post-op Dx: Nasal septal hematoma  Proc: Incision and drainage, left nasal septal hematoma   Surg:  Flo ShanksWOLICKI, [name] MD  Anes:  GOT  EBL:  Minimal  Comp:  None  Findings:  Anterior rightward nasal septal deviation. Some residual clots and early organizing granulation tissue. Relatively small quadrangular cartilage which appears healthy. Thick perpendicular plate bone. No pus.  Procedure: With the patient in a comfortable supine position, general orotracheal anesthesia was induced without difficulty.   The patient had received preoperative Afrin spray.  At an appropriate level, the patient was placed in a slight sitting position. A saline moistened throat pack was placed. Afrin on cotton pledgets was applied against the nasal septum on both sides. 1/2% Xylocaine with 1-200,000 epinephrine was infiltrated into the anterior left septum, 3 ML total. Several minutes were allowed for this to take effect. A clean preparation and draping of the face was accomplished in the standard fashion.  The materials were removed from the nose and observed to be intact and correct in number. The findings were as described above. The previous puncture site on the left was noted. A liberal hemitransfixion incision was sharply executed on the left side and blood clots were identified. The cartilage had already been exposed with the perichondrium was lifted. Cultures were taken in the cavity for aerobes and anaerobes. The cavity was thoroughly explored and all blood clots were suctioned clear and all granulation tissue was debrided. Some of the granulation tissue was also sent for culture.  The posterior inferior corner of the quadrangular cartilage including a tail along the vomer were submucosally resected to allow communication with the right submucoperichondrial plane. No obvious blood was identified. Hemostasis was  spontaneous.  The right submucoperichondrial plane and to a greater degree the left submucoperiosteal and submucoperichondrial plane were loosely packed with 1/4 inch iodoform Nu Gauze to prevent hematoma recurrence. A large Merocel pack was cut into 2 pieces both of which had soaked tags. This was placed high and low in the left nose to support the septum. The packs were moistened and expanded with Cortisporin otic suspension.  Again hemostasis was observed. At this point the procedure was completed. The pharynx was suctioned clear and the throat pack was removed. The patient was returned to anesthesia, awakened, extubated, and transferred to recovery in stable condition.  Dispo:   PACU to 6 N.   Plan:   IV antibiosis pending culture results. We'll remove the septal packing in 2 days and the nasal pack in 5 days. Depending on the culture results, it may make sense to put a PICC line in and have the patient on home antibiotic therapy. For now, ice, elevation, analgesia, and nasal hygiene measures.  Cephus RicherWOLICKI,  [name] MD

## 2014-01-30 NOTE — Anesthesia Preprocedure Evaluation (Addendum)
Anesthesia Evaluation  Patient identified by MRN, date of birth, ID band Patient awake    Reviewed: Allergy & Precautions, H&P , NPO status , Patient's Chart, lab work & pertinent test results, reviewed documented beta blocker date and time   History of Anesthesia Complications Negative for: history of anesthetic complications  Airway Mallampati: II TM Distance: >3 FB Neck ROM: Full    Dental  (+) Dental Advisory Given, Teeth Intact   Pulmonary neg pulmonary ROS,  breath sounds clear to auscultation        Cardiovascular negative cardio ROS  Rhythm:Regular     Neuro/Psych  Headaches, negative psych ROS   GI/Hepatic negative GI ROS, Neg liver ROS,   Endo/Other  negative endocrine ROS  Renal/GU negative Renal ROS     Musculoskeletal negative musculoskeletal ROS (+)   Abdominal   Peds  Hematology negative hematology ROS (+)   Anesthesia Other Findings   Reproductive/Obstetrics negative OB ROS                         Anesthesia Physical Anesthesia Plan  ASA: I  Anesthesia Plan: General   Post-op Pain Management:    Induction: Intravenous  Airway Management Planned: Oral ETT  Additional Equipment: None  Intra-op Plan:   Post-operative Plan: Extubation in OR  Informed Consent: I have reviewed the patients History and Physical, chart, labs and discussed the procedure including the risks, benefits and alternatives for the proposed anesthesia with the patient or authorized representative who has indicated his/her understanding and acceptance.   Dental advisory given  Plan Discussed with: CRNA and Surgeon  Anesthesia Plan Comments:         Anesthesia Quick Evaluation

## 2014-01-30 NOTE — Progress Notes (Signed)
Pt is sleeping at times, but wakes with "terrible throbbing pain". Dr. Lazarus SalinesWolicki aware. Verbal order to restart PRN Oxy IR.

## 2014-01-31 NOTE — Progress Notes (Signed)
01/31/2014 12:31 PM  Netta NeatMartin, Jasmine [id number]  Post-Op Day 1    Temp:  [97.6 F (36.4 C)-98.5 F (36.9 C)] 98.1 F (36.7 C) (06/14 0932) Pulse Rate:  [69-87] 79 (06/14 0932) Resp:  [16] 16 (06/14 0932) BP: (120-159)/(68-85) 134/69 mmHg (06/14 0932) SpO2:  [97 %-99 %] 98 % (06/14 0932),     Intake/Output Summary (Last 24 hours) at 01/31/14 1231 Last data filed at 01/31/14 0600  Gross per 24 hour  Intake 2743.33 ml  Output   1800 ml  Net 943.33 ml    Cultures, gram stains all still pending.  SUBJECTIVE:  Mod-lg LEFT nasal pain.  RIGHT nose also blocked.  No bleeding.  No SOB.  Voiding spontaneously.  Pain reasonably controlled  OBJECTIVE:  Tired.  Nasal packs secure  IMPRESSION:  Satisfactory check  PLAN:  Will remove nu gauze in AM.  Await cultures.  Advance diet and activity.  Will decide based on cultures when she will be ready to go home, and if po or iv abx are appropriate.  Flo ShanksWOLICKI, Shana Zavaleta

## 2014-01-31 NOTE — Progress Notes (Signed)
Pt right arm painful at IV site, unable obtain IV access, called IV team, awaiting call back

## 2014-02-01 ENCOUNTER — Encounter (HOSPITAL_COMMUNITY): Payer: Self-pay | Admitting: Otolaryngology

## 2014-02-01 NOTE — Progress Notes (Signed)
02/01/2014 9:36 AM  Netta NeatMartin, Sary [id number]  Post-Op Day 2    Temp:  [97.2 F (36.2 C)-98.8 F (37.1 C)] 97.2 F (36.2 C) (06/15 0911) Pulse Rate:  [80-97] 94 (06/15 0911) Resp:  [16-18] 16 (06/15 0911) BP: (119-131)/(66-88) 131/78 mmHg (06/15 0911) SpO2:  [93 %-98 %] 96 % (06/15 0911),     Intake/Output Summary (Last 24 hours) at 02/01/14 0936 Last data filed at 02/01/14 0900  Gross per 24 hour  Intake   1695 ml  Output      0 ml  Net   1695 ml   All cultures negative thus far.  No organisms seen on gram stain.  SUBJECTIVE:  Pain sl better.  Min drainage.  Eating, breathing, ambulating.  OBJECTIVE:  Nu gauze removed with mod bleeding which stopped spontaneously.    IMPRESSION:  Satisfactory check  PLAN:  Await cultures.  Cont IV abx.  Flo ShanksWOLICKI, Alexes Menchaca

## 2014-02-02 LAB — CULTURE, ROUTINE-ABSCESS

## 2014-02-02 NOTE — Progress Notes (Signed)
02/02/2014 9:10 AM  [name], Jasmine [id number]  Post-Op Day 3    Temp:  [97.2 F (36.2 C)-98.2 F (36.8 C)] 98.2 F (36.8 C) (06/16 0850) Pulse Rate:  [75-99] 80 (06/16 0850) Resp:  [16-18] 18 (06/16 0850) BP: (118-131)/(63-78) 122/73 mmHg (06/16 0850) SpO2:  [95 %-98 %] 95 % (06/16 0850),     Intake/Output Summary (Last 24 hours) at 02/02/14 0910 Last data filed at 02/01/14 1828  Gross per 24 hour  Intake    970 ml  Output      0 ml  Net    970 ml    Early tissue cultures showing coag negative staph. Abscess culture has been reincubated.  SUBJECTIVE:  Less pain.  OBJECTIVE:  Serous drainage.  IMPRESSION:  Satisfactory check  PLAN:  Await final culture and sensitivity.  Plan pack removal and discharge home in AM.  Hopefully will be able to choose a po antibiotic.  Flo ShanksWOLICKI, KAROL

## 2014-02-03 LAB — TISSUE CULTURE

## 2014-02-03 MED ORDER — OXYCODONE HCL 5 MG PO TABS: 5.0000 mg | ORAL_TABLET | ORAL | Status: DC | PRN

## 2014-02-03 NOTE — Progress Notes (Signed)
Discharge instructions reviewed with patient. All home medications and Rx reviewed. Care of surgical site reviewed and pt reminded of following up with MD. Pt reports she will be ready for discharge once her ride arrives.

## 2014-02-03 NOTE — Discharge Instructions (Signed)
Keep head elevated as comfortable Diet and activity unlimited Frequent nasal saline spray to keep your nose moist. Call me and let me know which antibiotics you still have at home, and how many please Call for bleeding, worsening pain, signs of infection [phone] Drip pad only if needed to catch drainage. Oxycodone pills for pain.

## 2014-02-03 NOTE — Discharge Summary (Signed)
  02/03/2014 11:50 AM  [name], Jasmine [id number]  Post-Op Day 4    Temp:  [98 F (36.7 C)-98.1 F (36.7 C)] 98.1 F (36.7 C) (06/17 78290632) Pulse Rate:  [71-83] 83 (06/16 2254) Resp:  [16-18] 16 (06/17 0632) BP: (105-125)/(66-83) 118/66 mmHg (06/17 0632) SpO2:  [97 %-98 %] 97 % (06/17 56210632),     Intake/Output Summary (Last 24 hours) at 02/03/14 1150 Last data filed at 02/03/14 1028  Gross per 24 hour  Intake   1430 ml  Output      0 ml  Net   1430 ml    Tissue culture grew coag neg staph aureus, sensitive to Clinda, Levaquin, Tetracycline.  SUBJECTIVE:  Less pain.  Anxious to go home  OBJECTIVE:  Nasal packs removed.  No significant bleeding.  IMPRESSION:  Satisfactory check  PLAN:    Discharge home  Admit:  12 JUN Discharge:  17 JUN Final Diagnosis: nasal septal abscess Proc: I&D nasal septal abscess, 13 JUN Comp:  None Cond:  Ambulatory, pain controlled.  Cultures positive.  Taking full reg diet Recheck:  2 weeks my office, [phone] Rx:  Oxycodone, 5 mg tabs, #40 Instructions written and given  Hosp Course:  Pt was admitted and put on IV Zosyn and Vanco with assistance of Pharmacists.  The following morning, she underwent I&D LEFT nasal septal abscess with iodoform nu gauze and Merocel packing.  Pt remained afebrile.  Mod to lg pain, controlled.  No significant bleeding.  On POD 2, nu gauze was removed.  On POD 4, Merocel packs removed.  Cultures showed coag neg staph aureus.  Pt discharged to home and care of family with Rx for Oxycodone.  She has antibiotics left at home and will call us and let us know what kind and how many she has left.    Flo ShanksWOLICKI, Timia Casselman

## 2014-02-04 LAB — ANAEROBIC CULTURE

## 2014-06-09 ENCOUNTER — Encounter (HOSPITAL_COMMUNITY): Payer: Self-pay | Admitting: Emergency Medicine

## 2014-06-09 ENCOUNTER — Emergency Department (HOSPITAL_COMMUNITY)
Admission: EM | Admit: 2014-06-09 | Discharge: 2014-06-09 | Disposition: A | Discharge status: Home / Self Care | Payer: Worker's Compensation | Attending: Emergency Medicine | Admitting: Emergency Medicine

## 2014-06-09 DIAGNOSIS — W57XXXA Bitten or stung by nonvenomous insect and other nonvenomous arthropods, initial encounter: Secondary | ICD-10-CM | POA: Diagnosis not present

## 2014-06-09 DIAGNOSIS — Z23 Encounter for immunization: Secondary | ICD-10-CM | POA: Insufficient documentation

## 2014-06-09 DIAGNOSIS — L089 Local infection of the skin and subcutaneous tissue, unspecified: Secondary | ICD-10-CM | POA: Insufficient documentation

## 2014-06-09 DIAGNOSIS — Z8619 Personal history of other infectious and parasitic diseases: Secondary | ICD-10-CM | POA: Insufficient documentation

## 2014-06-09 DIAGNOSIS — Y9289 Other specified places as the place of occurrence of the external cause: Secondary | ICD-10-CM | POA: Insufficient documentation

## 2014-06-09 DIAGNOSIS — Z792 Long term (current) use of antibiotics: Secondary | ICD-10-CM | POA: Insufficient documentation

## 2014-06-09 DIAGNOSIS — S80862A Insect bite (nonvenomous), left lower leg, initial encounter: Secondary | ICD-10-CM | POA: Diagnosis not present

## 2014-06-09 DIAGNOSIS — Y9389 Activity, other specified: Secondary | ICD-10-CM | POA: Insufficient documentation

## 2014-06-09 MED ORDER — SULFAMETHOXAZOLE-TRIMETHOPRIM 800-160 MG PO TABS: 1.0000 | ORAL_TABLET | Freq: Two times a day (BID) | ORAL | Status: AC

## 2014-06-09 MED ORDER — TETANUS-DIPHTH-ACELL PERTUSSIS 5-2.5-18.5 LF-MCG/0.5 IM SUSP
0.5000 mL | Freq: Once | INTRAMUSCULAR | Status: AC
  Administered 2014-06-09: 0.5 mL via INTRAMUSCULAR
  Filled 2014-06-09: qty 0.5

## 2014-06-09 NOTE — ED Notes (Signed)
Pt felt something bite her while at work on Sunday. Since then the area has become reddened and swollen on her left shin.

## 2014-06-09 NOTE — ED Provider Notes (Signed)
CSN: [id number]     Arrival date & time 06/09/14  1618 History  This chart was scribed for non-physician practitioner, Kerrie BuffaloHope Neese, NP working with Vida RollerBrian D Miller, MD, by Bronson CurbJacqueline Melvin, ED Scribe. This patient was seen in room APFT24/APFT24 and the patient's care was started at 4:42 PM.     Chief Complaint  Patient presents with  . Insect Bite    The history is provided by the patient. No language interpreter was used.    HPI Comments: Jasmine Gentry is a 38 y.o. female who presents to the Emergency Department complaining of an insect bite that occurred three days ago. She notes swelling and redness that began on two days ago. Pt did not see what bit her. Pt denies fever or drainage from the area. Patient is not UTD on tetanus.   Past Medical History  Diagnosis Date  . Chlamydia   . WUJWJXBJ(478.2Headache(784.0)    Past Surgical History  Procedure Laterality Date  . Tubal ligation    . Tonsillectomy    . Carpel tunnel release    . Nasal sinus surgery N/A 01/30/2014    Procedure: IRRIGATION AND DEBRIDEMENT NASAL SEPTAL ABSCESS;  Surgeon: Flo ShanksKarol Wolicki, MD;  [facility];  Service: ENT;  Laterality: N/A;   Family History  Problem Relation Age of Onset  . Asthma Mother   . Hypertension Mother   . Diabetes Maternal Aunt   . Hypertension Maternal Grandmother    History  Substance Use Topics  . Smoking status: Never Smoker   . Smokeless tobacco: Never Used  . Alcohol Use: Yes     Comment: occ.   OB History   Grav Para Term Preterm Abortions TAB SAB Ect Mult Living   4 3 3  0 1 0 1 0 0 3     Review of Systems  Constitutional: Negative for fever and chills.  Skin: Positive for color change (surrounding insect bite).       Insect bite to left lower leg.   All other systems negative.    Allergies  Review of patient's allergies indicates no known allergies.  Home Medications   Prior to Admission medications   Medication Sig Start Date End Date Taking? Authorizing Provider   clindamycin (CLEOCIN) 300 MG capsule Take 300 mg by mouth 3 (three) times daily.    Historical Provider, MD  etonogestrel-ethinyl estradiol (NUVARING) 0.12-0.015 MG/24HR vaginal ring Place 1 each vaginally every 28 (twenty-eight) days. Insert vaginally and leave in place for 3 consecutive weeks, then remove for 1 week.    Historical Provider, MD  oxyCODONE (OXY IR/ROXICODONE) 5 MG immediate release tablet Take 1-2 tablets (5-10 mg total) by mouth every 4 (four) hours as needed for severe pain. 02/03/14   Flo ShanksKarol Wolicki, MD  oxyCODONE (ROXICODONE) 5 MG immediate release tablet Take 1-2 tablets (5-10 mg total) by mouth every 4 (four) hours as needed for severe pain. 02/03/14   Flo ShanksKarol Wolicki, MD   Triage Vitals: BP 130/81  Pulse 84  Temp(Src) 98.6 F (37 C) (Oral)  Resp 16  Ht 5\' 8"  (1.727 m)  Wt 210 lb (95.255 kg)  BMI 31.94 kg/m2  SpO2 100%  LMP 05/16/2014  Physical Exam  Nursing note and vitals reviewed. Constitutional: She is oriented to person, place, and time. She appears well-developed and well-nourished. No distress.  HENT:  Head: Normocephalic and atraumatic.  Eyes: Conjunctivae and EOM are normal.  Neck: Neck supple. No tracheal deviation present.  Cardiovascular: Normal rate.   Pulmonary/Chest: Effort  normal. No respiratory distress.  Musculoskeletal: Normal range of motion.  Neurological: She is alert and oriented to person, place, and time.  Skin: Skin is warm and dry.  2.5 cm area to the anterior aspect of the left lower leg. There is an area in the center that has a tiny pustular lesion surrounded by erythema. Tender on exam. No red streaking noted.   Psychiatric: She has a normal mood and affect. Her behavior is normal.    ED Course  Procedures (including critical care time)  DIAGNOSTIC STUDIES: Oxygen Saturation is 100% on RA, normal by my interpretation.    COORDINATION OF CARE:  4:46 PM- Pt advised of plan for treatment and pt agrees.  Labs Review  MDM  38  y.o. female with infected insect bite to the left lower anterior leg. Will treat with antibiotics and she will apply warm wet compresses and elevate as often as possible. Stable for discharge without fever or red streaking noted. She will return as needed for worsening symptoms.  Discussed with the patient and all questioned fully answered.    Medication List    TAKE these medications       sulfamethoxazole-trimethoprim 800-160 MG per tablet  Commonly known as:  BACTRIM DS,SEPTRA DS  Take 1 tablet by mouth 2 (two) times daily.      ASK your doctor about these medications       etonogestrel-ethinyl estradiol 0.12-0.015 MG/24HR vaginal ring  Commonly known as:  NUVARING  Place 1 each vaginally every 28 (twenty-eight) days. Insert vaginally and leave in place for 3 consecutive weeks, then remove for 1 week.      I personally performed the services described in this documentation, which was scribed in my presence. The recorded information has been reviewed and is accurate.   Janne NapoleonHope M Neese, [place]NP 06/09/14 [phone]

## 2014-06-11 NOTE — ED Provider Notes (Signed)
Medical screening examination/treatment/procedure(s) were performed by non-physician practitioner and as supervising physician I was immediately available for consultation/collaboration.    Vida RollerBrian D Lanissa Cashen, MD 06/11/14 [phone]

## 2014-06-21 ENCOUNTER — Encounter (HOSPITAL_COMMUNITY): Payer: Self-pay | Admitting: Emergency Medicine

## 2014-08-14 ENCOUNTER — Encounter (HOSPITAL_COMMUNITY): Payer: Self-pay | Admitting: *Deleted

## 2014-08-14 ENCOUNTER — Emergency Department (HOSPITAL_COMMUNITY)
Admission: EM | Admit: 2014-08-14 | Discharge: 2014-08-14 | Disposition: A | Discharge status: Home / Self Care | Payer: Medicaid Other | Attending: Emergency Medicine | Admitting: Emergency Medicine

## 2014-08-14 DIAGNOSIS — G43909 Migraine, unspecified, not intractable, without status migrainosus: Secondary | ICD-10-CM | POA: Insufficient documentation

## 2014-08-14 DIAGNOSIS — G43009 Migraine without aura, not intractable, without status migrainosus: Secondary | ICD-10-CM

## 2014-08-14 DIAGNOSIS — Z8619 Personal history of other infectious and parasitic diseases: Secondary | ICD-10-CM | POA: Diagnosis not present

## 2014-08-14 DIAGNOSIS — R51 Headache: Secondary | ICD-10-CM | POA: Diagnosis present

## 2014-08-14 LAB — CBC
HCT: 38.5 % (ref 36.0–46.0)
Hemoglobin: 12.5 g/dL (ref 12.0–15.0)
MCH: 29.1 pg (ref 26.0–34.0)
MCHC: 32.5 g/dL (ref 30.0–36.0)
MCV: 89.7 fL (ref 78.0–100.0)
Platelets: 358 10*3/uL (ref 150–400)
RBC: 4.29 MIL/uL (ref 3.87–5.11)
RDW: 13.5 % (ref 11.5–15.5)
WBC: 3.8 10*3/uL — ABNORMAL LOW (ref 4.0–10.5)

## 2014-08-14 LAB — BASIC METABOLIC PANEL
Anion gap: 4 — ABNORMAL LOW (ref 5–15)
BUN: 15 mg/dL (ref 6–23)
CO2: 28 mmol/L (ref 19–32)
Calcium: 9.1 mg/dL (ref 8.4–10.5)
Chloride: 107 mEq/L (ref 96–112)
Creatinine, Ser: 0.93 mg/dL (ref 0.50–1.10)
GFR calc Af Amer: 90 mL/min — ABNORMAL LOW (ref >90)
GFR calc non Af Amer: 78 mL/min — ABNORMAL LOW (ref >90)
GLUCOSE: 87 mg/dL (ref 70–99)
Potassium: 3.7 mmol/L (ref 3.5–5.1)
Sodium: 139 mmol/L (ref 135–145)

## 2014-08-14 MED ORDER — METOCLOPRAMIDE HCL 5 MG/ML IJ SOLN
10.0000 mg | Freq: Once | INTRAMUSCULAR | Status: AC
  Administered 2014-08-14: 10 mg via INTRAVENOUS
  Filled 2014-08-14: qty 2

## 2014-08-14 MED ORDER — SODIUM CHLORIDE 0.9 % IV BOLUS (SEPSIS)
1000.0000 mL | INTRAVENOUS | Status: AC
  Administered 2014-08-14: 1000 mL via INTRAVENOUS

## 2014-08-14 MED ORDER — KETOROLAC TROMETHAMINE 30 MG/ML IJ SOLN
30.0000 mg | Freq: Once | INTRAMUSCULAR | Status: AC
  Administered 2014-08-14: 30 mg via INTRAVENOUS
  Filled 2014-08-14: qty 1

## 2014-08-14 MED ORDER — DIPHENHYDRAMINE HCL 50 MG/ML IJ SOLN
25.0000 mg | Freq: Once | INTRAMUSCULAR | Status: AC
  Administered 2014-08-14: 25 mg via INTRAVENOUS
  Filled 2014-08-14: qty 1

## 2014-08-14 NOTE — ED Notes (Signed)
Patient states she has been a migraine and vomiting since yesterday. She has been taking ibuprofen for the migraine with no relief. She has been on migraine medication in the past but she no longer takes them and does not remember the name. She complains of sensitivity to light. She has not had food or drink since yesterday evening around 7pm due to the nausea.

## 2014-08-14 NOTE — Discharge Instructions (Signed)
1. Medications: usual home medications 2. Treatment: rest, drink plenty of fluids,  3. Follow Up: Please followup with your primary doctor in 3 days for discussion of your diagnoses and further evaluation after today's visit; if you do not have a primary care doctor use the resource guide provided to find one; Please return to the ER for worsening headaches, persistent vomiting or other concerning symptoms.    Migraine Headache A migraine headache is an intense, throbbing pain on one or both sides of your head. A migraine can last for 30 minutes to several hours. CAUSES  The exact cause of a migraine headache is not always known. However, a migraine may be caused when nerves in the brain become irritated and release chemicals that cause inflammation. This causes pain. Certain things may also trigger migraines, such as:  Alcohol.  Smoking.  Stress.  Menstruation.  Aged cheeses.  Foods or drinks that contain nitrates, glutamate, aspartame, or tyramine.  Lack of sleep.  Chocolate.  Caffeine.  Hunger.  Physical exertion.  Fatigue.  Medicines used to treat chest pain (nitroglycerine), birth control pills, estrogen, and some blood pressure medicines. SIGNS AND SYMPTOMS  Pain on one or both sides of your head.  Pulsating or throbbing pain.  Severe pain that prevents daily activities.  Pain that is aggravated by any physical activity.  Nausea, vomiting, or both.  Dizziness.  Pain with exposure to bright lights, loud noises, or activity.  General sensitivity to bright lights, loud noises, or smells. Before you get a migraine, you may get warning signs that a migraine is coming (aura). An aura may include:  Seeing flashing lights.  Seeing bright spots, halos, or zigzag lines.  Having tunnel vision or blurred vision.  Having feelings of numbness or tingling.  Having trouble talking.  Having muscle weakness. DIAGNOSIS  A migraine headache is often diagnosed  based on:  Symptoms.  Physical exam.  A CT scan or MRI of your head. These imaging tests cannot diagnose migraines, but they can help rule out other causes of headaches. TREATMENT Medicines may be given for pain and nausea. Medicines can also be given to help prevent recurrent migraines.  HOME CARE INSTRUCTIONS  Only take over-the-counter or prescription medicines for pain or discomfort as directed by your health care provider. The use of long-term narcotics is not recommended.  Lie down in a dark, quiet room when you have a migraine.  Keep a journal to find out what may trigger your migraine headaches. For example, write down:  What you eat and drink.  How much sleep you get.  Any change to your diet or medicines.  Limit alcohol consumption.  Quit smoking if you smoke.  Get 7-9 hours of sleep, or as recommended by your health care provider.  Limit stress.  Keep lights dim if bright lights bother you and make your migraines worse. SEEK IMMEDIATE MEDICAL CARE IF:   Your migraine becomes severe.  You have a fever.  You have a stiff neck.  You have vision loss.  You have muscular weakness or loss of muscle control.  You start losing your balance or have trouble walking.  You feel faint or pass out.  You have severe symptoms that are different from your first symptoms. MAKE SURE YOU:   Understand these instructions.  Will watch your condition.  Will get help right away if you are not doing well or get worse. Document Released: 08/06/2005 Document Revised: 12/21/2013 Document Reviewed: 04/13/2013 [facility] Patient Information 2015 [employer]. This information is not intended to replace advice given to you by your health care provider. Make sure you discuss any questions you have with your health care provider.

## 2014-08-14 NOTE — ED Provider Notes (Signed)
CSN: [id number]     Arrival date & time 08/14/14  1336 History   First MD Initiated Contact with Patient 08/14/14 1534     Chief Complaint  Patient presents with  . Migraine  . Emesis     (Consider location/radiation/quality/duration/timing/severity/associated sxs/prior Treatment) Patient is a 38 y.o. female presenting with migraines and vomiting. The history is provided by the patient and medical records. No language interpreter was used.  Migraine Associated symptoms include headaches, nausea and vomiting. Pertinent negatives include no abdominal pain, chest pain, coughing, diaphoresis, fatigue, fever or rash.  Emesis Associated symptoms: headaches   Associated symptoms: no abdominal pain and no diarrhea      Berklee K Daphine DeutscherMartin is a 38 y.o. female  with a hx of migraine headache presents to the Emergency Department complaining of gradual, persistent, progressively worsening headache onset yesterday morning. Patient reports headache is generalized and throbbing, identical to previous migraines. Associated symptoms include photophobia, phonophobia, nausea and emesis 3.  Patient reports emesis was nonbloody and nonbilious. No diarrhea.  Patient reports she believes her headaches are related to her menstrual cycle. She reports she has not had a migraine in over one year. She reports that for the last year she has been using a NuvaRing to control heavy periods and migraine headaches which she reports was working correctly however she had persistent fatigue which she attributed to this birth control method. She reports that one month ago she stopped this medication. She reports that approximately 10 days ago her menses began and has been very heavy.  She states she believes this is the trigger for her migraine headache. She has used ibuprofen at home without relief.  Patient denies fever, chills, neck pain, neck stiffness, chest pain, shortness of breath, abdominal pain, diarrhea, dizziness, dysuria,  syncope.  Patient reports bilateral tubal ligation.  Past Medical History  Diagnosis Date  . Chlamydia   . ONGEXBMW(413.2Headache(784.0)    Past Surgical History  Procedure Laterality Date  . Tubal ligation    . Tonsillectomy    . Carpel tunnel release    . Nasal sinus surgery N/A 01/30/2014    Procedure: IRRIGATION AND DEBRIDEMENT NASAL SEPTAL ABSCESS;  Surgeon: Flo ShanksKarol Wolicki, MD;  [facility];  Service: ENT;  Laterality: N/A;   Family History  Problem Relation Age of Onset  . Asthma Mother   . Hypertension Mother   . Diabetes Maternal Aunt   . Hypertension Maternal Grandmother    History  Substance Use Topics  . Smoking status: Never Smoker   . Smokeless tobacco: Never Used  . Alcohol Use: Yes     Comment: occ.   OB History    Gravida Para Term Preterm AB TAB SAB Ectopic Multiple Living   4 3 3  0 1 0 1 0 0 3     Review of Systems  Constitutional: Negative for fever, diaphoresis, appetite change, fatigue and unexpected weight change.  HENT: Negative for mouth sores.   Eyes: Positive for photophobia. Negative for visual disturbance.  Respiratory: Negative for cough, chest tightness, shortness of breath and wheezing.   Cardiovascular: Negative for chest pain.  Gastrointestinal: Positive for nausea and vomiting. Negative for abdominal pain, diarrhea and constipation.  Endocrine: Negative for polydipsia, polyphagia and polyuria.  Genitourinary: Negative for dysuria, urgency, frequency and hematuria.  Musculoskeletal: Negative for back pain and neck stiffness.  Skin: Negative for rash.  Allergic/Immunologic: Negative for immunocompromised state.  Neurological: Positive for headaches. Negative for syncope and light-headedness.  Hematological: Does not  bruise/bleed easily.  Psychiatric/Behavioral: Negative for sleep disturbance. The patient is not nervous/anxious.       Allergies  Review of patient's allergies indicates no known allergies.  Home Medications   Prior to  Admission medications   Medication Sig Start Date End Date Taking? Authorizing Provider  ibuprofen (ADVIL,MOTRIN) 200 MG tablet Take 400-600 mg by mouth every 6 (six) hours as needed for moderate pain.   Yes Historical Provider, MD   BP 126/74 mmHg  Pulse 81  Temp(Src) 98.6 F (37 C) (Oral)  Resp 20  SpO2 96%  LMP 07/31/2014 Physical Exam  Constitutional: She is oriented to person, place, and time. She appears well-developed and well-nourished. No distress.  HENT:  Head: Normocephalic and atraumatic.  Mouth/Throat: Oropharynx is clear and moist.  Eyes: Conjunctivae and EOM are normal. Pupils are equal, round, and reactive to light. No scleral icterus.  No horizontal, vertical or rotational nystagmus  Neck: Normal range of motion. Neck supple.  Full active and passive ROM without pain No midline or paraspinal tenderness No nuchal rigidity or meningeal signs  Cardiovascular: Normal rate, regular rhythm, normal heart sounds and intact distal pulses.   Pulmonary/Chest: Effort normal and breath sounds normal. No respiratory distress. She has no wheezes. She has no rales.  Abdominal: Soft. Bowel sounds are normal. She exhibits no distension. There is no tenderness. There is no rebound and no guarding.  Musculoskeletal: Normal range of motion.  Lymphadenopathy:    She has no cervical adenopathy.  Neurological: She is alert and oriented to person, place, and time. She has normal reflexes. No cranial nerve deficit. She exhibits normal muscle tone. Coordination normal.  Mental Status:  Alert, oriented, thought content appropriate. Speech fluent without evidence of aphasia. Able to follow 2 step commands without difficulty.  Cranial Nerves:  II:  Peripheral visual fields grossly normal, pupils equal, round, reactive to light III,IV, VI: ptosis not present, extra-ocular motions intact bilaterally  V,VII: smile symmetric, facial light touch sensation equal VIII: hearing grossly normal  bilaterally  IX,X: gag reflex present  XI: bilateral shoulder shrug equal and strong XII: midline tongue extension  Motor:  5/5 in upper and lower extremities bilaterally including strong and equal grip strength and dorsiflexion/plantar flexion Sensory: Pinprick and light touch normal in all extremities.  Deep Tendon Reflexes: 2+ and symmetric  Cerebellar: normal finger-to-nose with bilateral upper extremities Gait: normal gait and balance CV: distal pulses palpable throughout   Skin: Skin is warm and dry. No rash noted. She is not diaphoretic. No erythema.  Psychiatric: She has a normal mood and affect. Her behavior is normal. Judgment and thought content normal.  Nursing note and vitals reviewed.   ED Course  Procedures (including critical care time) Labs Review Labs Reviewed  CBC - Abnormal; Notable for the following:    WBC 3.8 (*)    All other components within normal limits  BASIC METABOLIC PANEL - Abnormal; Notable for the following:    GFR calc non Af Amer 78 (*)    GFR calc Af Amer 90 (*)    Anion gap 4 (*)    All other components within normal limits    Imaging Review No results found.   EKG Interpretation None      MDM   Final diagnoses:  Migraine without aura and without status migrainosus, not intractable   Shacara K Daphine DeutscherMartin presents with headache x2 days with hx of same and reports of identical symptoms.  Pt also with complaints of menorrhagia and  fatigue, will check CBC to ensure no anemia.  6:44 PM Pt HA treated and resolved while in ED.  Presentation is like pts typical HA and non concerning for [facility], ICH, Meningitis, or temporal arteritis. Pt is afebrile with no focal neuro deficits, nuchal rigidity, or change in vision. No anemia on CBC. Pt is to follow up with PCP for headaches and menorrhagia. Pt verbalizes understanding and is agreeable with plan to dc.   I have personally reviewed patient's vitals, nursing note and any pertinent labs or imaging.  I  performed an undressed physical exam.    It has been determined that no acute conditions requiring further emergency intervention are present at this time. The patient/guardian have been advised of the diagnosis and plan. I reviewed all labs and imaging including any potential incidental findings. We have discussed signs and symptoms that warrant return to the ED and they are listed in the discharge instructions.    Vital signs are stable at discharge.   BP 126/74 mmHg  Pulse 81  Temp(Src) 98.6 F (37 C) (Oral)  Resp 20  SpO2 96%  LMP 07/31/2014         Dierdre Forth, PA-C 08/14/14 1844  Raeford Razor, MD 08/16/14 1452

## 2014-10-04 ENCOUNTER — Emergency Department (HOSPITAL_COMMUNITY): Payer: Medicaid Other

## 2014-10-04 ENCOUNTER — Encounter (HOSPITAL_COMMUNITY): Payer: Self-pay | Admitting: Emergency Medicine

## 2014-10-04 ENCOUNTER — Emergency Department (HOSPITAL_COMMUNITY)
Admission: EM | Admit: 2014-10-04 | Discharge: 2014-10-04 | Disposition: A | Discharge status: Home / Self Care | Payer: Medicaid Other | Attending: Emergency Medicine | Admitting: Emergency Medicine

## 2014-10-04 DIAGNOSIS — R05 Cough: Secondary | ICD-10-CM | POA: Diagnosis present

## 2014-10-04 DIAGNOSIS — J069 Acute upper respiratory infection, unspecified: Secondary | ICD-10-CM | POA: Diagnosis not present

## 2014-10-04 DIAGNOSIS — Z8619 Personal history of other infectious and parasitic diseases: Secondary | ICD-10-CM | POA: Insufficient documentation

## 2014-10-04 MED ORDER — GUAIFENESIN 100 MG/5ML PO LIQD: 100.0000 mg | ORAL | Status: DC | PRN

## 2014-10-04 NOTE — ED Provider Notes (Signed)
CSN: [id number]     Arrival date & time 10/04/14  1303 History  This chart was scribed for Jasmine Sprout, MD by Tonye Royalty, ED Scribe. This patient was seen in room WTR5/WTR5 and the patient's care was started at 1:20 PM.    Chief Complaint  Patient presents with  . URI   The history is provided by the patient. No language interpreter was used.    HPI Comments: JAMILE REKOWSKI is a 39 y.o. female who presents to the Emergency Department complaining of cough with onset 2 days ago. She states her cough is worse at night and is productive at times. She reports associated subjective fever, chills, rhinorrhea, and sore throat. She denies history of smoking, asthma, or COPD. She states she has been around many sick family members.  Past Medical History  Diagnosis Date  . Chlamydia   . YQIHKVQQ(595.6)    Past Surgical History  Procedure Laterality Date  . Tubal ligation    . Tonsillectomy    . Carpel tunnel release    . Nasal sinus surgery N/A 01/30/2014    Procedure: IRRIGATION AND DEBRIDEMENT NASAL SEPTAL ABSCESS;  Surgeon: Flo Shanks, MD;  Location: [facility] OR;  Service: ENT;  Laterality: N/A;   Family History  Problem Relation Age of Onset  . Asthma Mother   . Hypertension Mother   . Diabetes Maternal Aunt   . Hypertension Maternal Grandmother    History  Substance Use Topics  . Smoking status: Never Smoker   . Smokeless tobacco: Never Used  . Alcohol Use: Yes     Comment: occ.   OB History    Gravida Para Term Preterm AB TAB SAB Ectopic Multiple Living   0 1 0 1 0 0 3     Review of Systems  Constitutional: Positive for fever and chills.  HENT: Positive for postnasal drip, rhinorrhea, sinus pressure, sneezing and sore throat.   Respiratory: Positive for cough. Negative for shortness of breath.   Cardiovascular: Negative for chest pain.  Gastrointestinal: Negative for nausea, vomiting, abdominal pain, diarrhea and constipation.  Genitourinary: Negative for dysuria.       Allergies  Review of patient's allergies indicates no known allergies.  Home Medications   Prior to Admission medications   Medication Sig Start Date End Date Taking? Authorizing Provider  ibuprofen (ADVIL,MOTRIN) 200 MG tablet Take 400-600 mg by mouth every 6 (six) hours as needed for moderate pain.    Historical Provider, MD   BP 134/70 mmHg  Pulse 99  Temp(Src) 97.9 F (36.6 C) (Oral)  Resp 16  SpO2 100%  LMP 10/04/2014 Physical Exam  Constitutional: She appears well-developed and well-nourished. No distress.  HENT:  Head: Normocephalic.  Right Ear: External ear normal.  Left Ear: External ear normal.  Mildly erythematous, no tonsillar exudate, no abscess, no stridor, uvula is midline  TMs clear bilaterally  Eyes: Conjunctivae and EOM are normal. Pupils are equal, round, and reactive to light.  Neck: Normal range of motion. Neck supple.  Cardiovascular: Normal rate, regular rhythm and normal heart sounds.  Exam reveals no gallop and no friction rub.   No murmur heard. Pulmonary/Chest: Effort normal and breath sounds normal. No stridor. No respiratory distress. She has no wheezes. She has no rales. She exhibits no tenderness.  CTAB  Abdominal: Soft. Bowel sounds are normal. She exhibits no distension. There is no tenderness.  Musculoskeletal: Normal range of motion. She exhibits no tenderness.  Neurological: She is alert.  Skin: Skin is warm and dry. No rash noted. She is not diaphoretic.  Psychiatric: She has a normal mood and affect. Her behavior is normal. Judgment and thought content normal.  Nursing note and vitals reviewed.   ED Course  Procedures (including critical care time)  DIAGNOSTIC STUDIES: Oxygen Saturation is 100% on room air, normal by my interpretation.    COORDINATION OF CARE: 1:25 PM Discussed treatment plan with patient at beside, including chest x-ray. The patient agrees with the plan and has no further questions at this time.   Labs  Review Labs Reviewed - No data to display  Imaging Review No results found.   EKG Interpretation None      MDM   Final diagnoses:  URI (upper respiratory infection)    Pt CXR negative for acute infiltrate. Patients symptoms are consistent with URI, likely viral etiology. Discussed that antibiotics are not indicated for viral infections. Pt will be discharged with symptomatic treatment.  Verbalizes understanding and is agreeable with plan. Pt is hemodynamically stable & in NAD prior to dc.   I personally performed the services described in this documentation, which was scribed in my presence. The recorded information has been reviewed and is accurate.    Roxy Horsemanobert Mckinnley Smithey, PA-C 10/04/14 1352  Jasmine SproutWhitney Plunkett, MD 10/04/14 1539

## 2014-10-04 NOTE — ED Notes (Signed)
Peak flow 125

## 2014-10-04 NOTE — ED Notes (Signed)
Per pt, states cold symptoms for about 3 days-cough, dry

## 2014-10-04 NOTE — Discharge Instructions (Signed)
Upper Respiratory Infection, Adult An upper respiratory infection (URI) is also sometimes known as the common cold. The upper respiratory tract includes the nose, sinuses, throat, trachea, and bronchi. Bronchi are the airways leading to the lungs. Most people improve within 1 week, but symptoms can last up to 2 weeks. A residual cough may last even longer.  CAUSES Many different viruses can infect the tissues lining the upper respiratory tract. The tissues become irritated and inflamed and often become very moist. Mucus production is also common. A cold is contagious. You can easily spread the virus to others by oral contact. This includes kissing, sharing a glass, coughing, or sneezing. Touching your mouth or nose and then touching a surface, which is then touched by another person, can also spread the virus. SYMPTOMS  Symptoms typically develop 1 to 3 days after you come in contact with a cold virus. Symptoms vary from person to person. They may include:  Runny nose.  Sneezing.  Nasal congestion.  Sinus irritation.  Sore throat.  Loss of voice (laryngitis).  Cough.  Fatigue.  Muscle aches.  Loss of appetite.  Headache.  Low-grade fever. DIAGNOSIS  You might diagnose your own cold based on familiar symptoms, since most people get a cold 2 to 3 times a year. Your caregiver can confirm this based on your exam. Most importantly, your caregiver can check that your symptoms are not due to another disease such as strep throat, sinusitis, pneumonia, asthma, or epiglottitis. Blood tests, throat tests, and X-rays are not necessary to diagnose a common cold, but they may sometimes be helpful in excluding other more serious diseases. Your caregiver will decide if any further tests are required. RISKS AND COMPLICATIONS  You may be at risk for a more severe case of the common cold if you smoke cigarettes, have chronic heart disease (such as heart failure) or lung disease (such as asthma), or if  you have a weakened immune system. The very young and very old are also at risk for more serious infections. Bacterial sinusitis, middle ear infections, and bacterial pneumonia can complicate the common cold. The common cold can worsen asthma and chronic obstructive pulmonary disease (COPD). Sometimes, these complications can require emergency medical care and may be life-threatening. PREVENTION  The best way to protect against getting a cold is to practice good hygiene. Avoid oral or hand contact with people with cold symptoms. Wash your hands often if contact occurs. There is no clear evidence that vitamin C, vitamin E, echinacea, or exercise reduces the chance of developing a cold. However, it is always recommended to get plenty of rest and practice good nutrition. TREATMENT  Treatment is directed at relieving symptoms. There is no cure. Antibiotics are not effective, because the infection is caused by a virus, not by bacteria. Treatment may include:  Increased fluid intake. Sports drinks offer valuable electrolytes, sugars, and fluids.  Breathing heated mist or steam (vaporizer or shower).  Eating chicken soup or other clear broths, and maintaining good nutrition.  Getting plenty of rest.  Using gargles or lozenges for comfort.  Controlling fevers with ibuprofen or acetaminophen as directed by your caregiver.  Increasing usage of your inhaler if you have asthma. Zinc gel and zinc lozenges, taken in the first 24 hours of the common cold, can shorten the duration and lessen the severity of symptoms. Pain medicines may help with fever, muscle aches, and throat pain. A variety of non-prescription medicines are available to treat congestion and runny nose. Your caregiver   can make recommendations and may suggest nasal or lung inhalers for other symptoms.  HOME CARE INSTRUCTIONS   Only take over-the-counter or prescription medicines for pain, discomfort, or fever as directed by your  caregiver.  Use a warm mist humidifier or inhale steam from a shower to increase air moisture. This may keep secretions moist and make it easier to breathe.  Drink enough water and fluids to keep your urine clear or pale yellow.  Rest as needed.  Return to work when your temperature has returned to normal or as your caregiver advises. You may need to stay home longer to avoid infecting others. You can also use a face mask and careful hand washing to prevent spread of the virus. SEEK MEDICAL CARE IF:   After the first few days, you feel you are getting worse rather than better.  You need your caregiver's advice about medicines to control symptoms.  You develop chills, worsening shortness of breath, or brown or red sputum. These may be signs of pneumonia.  You develop yellow or brown nasal discharge or pain in the face, especially when you bend forward. These may be signs of sinusitis.  You develop a fever, swollen neck glands, pain with swallowing, or white areas in the back of your throat. These may be signs of strep throat. SEEK IMMEDIATE MEDICAL CARE IF:   You have a fever.  You develop severe or persistent headache, ear pain, sinus pain, or chest pain.  You develop wheezing, a prolonged cough, cough up blood, or have a change in your usual mucus (if you have chronic lung disease).  You develop sore muscles or a stiff neck. Document Released: 01/30/2001 Document Revised: 10/29/2011 Document Reviewed: 11/11/2013 Exit[employer] Patient Information 2015 [employer]. This information is not intended to replace advice given to you by your health care provider. Make sure you discuss any questions you have with your health care provider.  

## 2014-10-15 ENCOUNTER — Emergency Department (HOSPITAL_COMMUNITY): Admission: EM | Admit: 2014-10-15 | Discharge: 2014-10-15 | Discharge status: Absent without leave | Payer: Self-pay

## 2014-12-30 ENCOUNTER — Encounter (HOSPITAL_COMMUNITY): Payer: Self-pay | Admitting: Emergency Medicine

## 2014-12-30 ENCOUNTER — Emergency Department (HOSPITAL_COMMUNITY)
Admission: EM | Admit: 2014-12-30 | Discharge: 2014-12-30 | Disposition: A | Discharge status: Home / Self Care | Payer: Medicaid Other | Attending: Emergency Medicine | Admitting: Emergency Medicine

## 2014-12-30 DIAGNOSIS — Z8619 Personal history of other infectious and parasitic diseases: Secondary | ICD-10-CM | POA: Diagnosis not present

## 2014-12-30 DIAGNOSIS — L02416 Cutaneous abscess of left lower limb: Secondary | ICD-10-CM | POA: Insufficient documentation

## 2014-12-30 MED ORDER — LIDOCAINE-EPINEPHRINE 2 %-1:100000 IJ SOLN
20.0000 mL | Freq: Once | INTRAMUSCULAR | Status: AC
  Administered 2014-12-30: 1 mL
  Filled 2014-12-30: qty 1

## 2014-12-30 MED ORDER — HYDROCODONE-ACETAMINOPHEN 5-325 MG PO TABS
1.0000 | ORAL_TABLET | Freq: Four times a day (QID) | ORAL | Status: DC | PRN
Start: 2014-12-30 — End: 2017-08-17

## 2014-12-30 MED ORDER — HYDROCODONE-ACETAMINOPHEN 5-325 MG PO TABS
2.0000 | ORAL_TABLET | Freq: Once | ORAL | Status: AC
  Administered 2014-12-30: 2 via ORAL
  Filled 2014-12-30: qty 2

## 2014-12-30 NOTE — Discharge Instructions (Signed)

## 2014-12-30 NOTE — ED Provider Notes (Signed)
CSN: [id number]     Arrival date & time 12/30/14  2208 History  This chart was scribed for non-physician practitioner, Jasmine MaduraKelly Vladimir Lenhoff, PA-C, working with Zadie Rhineonald Wickline, MD, by Modena JanskyAlbert Thayil, ED Scribe. This patient was seen in room WTR7/WTR7 and the patient's care was started at 10:48 PM.   Chief Complaint  Patient presents with  . Abscess   Patient is a 39 y.o. female presenting with abscess. The history is provided by the patient. No language interpreter was used.  Abscess Location:  Leg Leg abscess location:  L upper leg Abscess quality: painful   Abscess quality: not draining   Duration:  3 hours Progression:  Worsening Pain details:    Severity:  Moderate   Duration:  3 days   Timing:  Constant   Progression:  Worsening Chronicity:  Recurrent Relieved by:  Nothing Worsened by:  Nothing tried Ineffective treatments:  Draining/squeezing and warm compresses Risk factors: prior abscess    HPI Comments: Jasmine Gentry is a 39 y.o. female with a hx of abscess who presents to the Emergency Department complaining of a left upper thigh abscess that started 2 days ago. She reports that he has had a worsening boil to the her left upper thigh since 2 days ago. She reports having worsening constant moderate pain at the affected area. She states that she tried to pop the boil, use a warm compress, and took pain medication without relief. She reports no prior hx of abscess in the currently affected area. She denies any drainage.   Past Medical History  Diagnosis Date  . Chlamydia   . [id number](478.2Headache(784.0)    Past Surgical History  Procedure Laterality Date  . Tubal ligation    . Tonsillectomy    . Carpel tunnel release    . Nasal sinus surgery N/A 01/30/2014    Procedure: IRRIGATION AND DEBRIDEMENT NASAL SEPTAL ABSCESS;  Surgeon: Flo ShanksKarol Wolicki, MD;  [facility];  Service: ENT;  Laterality: N/A;   Family History  Problem Relation Age of Onset  . Asthma Mother   . Hypertension Mother   .  Diabetes Maternal Aunt   . Hypertension Maternal Grandmother    History  Substance Use Topics  . Smoking status: Never Smoker   . Smokeless tobacco: Never Used  . Alcohol Use: Yes     Comment: occ.   OB History    Gravida Para Term Preterm AB TAB SAB Ectopic Multiple Living   4 3 3  0 1 0 1 0 0 3      Review of Systems  Skin: Positive for color change.  All other systems reviewed and are negative.   Allergies  Review of patient's allergies indicates no known allergies.  Home Medications   Prior to Admission medications   Medication Sig Start Date End Date Taking? Authorizing Provider  guaiFENesin (ROBITUSSIN) 100 MG/5ML liquid Take 5-10 mLs (100-200 mg total) by mouth every 4 (four) hours as needed for cough. 10/04/14   Roxy Horsemanobert Browning, PA-C  HYDROcodone-acetaminophen (NORCO/VICODIN) 5-325 MG per tablet Take 1 tablet by mouth every 6 (six) hours as needed for severe pain. 12/30/14   Jasmine MaduraKelly Chirag Krueger, PA-C  ibuprofen (ADVIL,MOTRIN) 200 MG tablet Take 400-600 mg by mouth every 6 (six) hours as needed for moderate pain.    Historical Provider, MD   BP 150/85 mmHg  Pulse 103  Temp(Src) 98.5 F (36.9 C) (Oral)  Resp 18  SpO2 100%  LMP 12/21/2014  Physical Exam  Constitutional: She is oriented to person,  place, and time. She appears well-developed and well-nourished. No distress.  Nontoxic/nonseptic appearing  HENT:  Head: Normocephalic and atraumatic.  Eyes: Conjunctivae and EOM are normal. No scleral icterus.  Neck: Normal range of motion.  Pulmonary/Chest: Effort normal. No respiratory distress.  Respirations even and unlabored  Musculoskeletal: Normal range of motion.       Left upper leg: She exhibits tenderness. She exhibits no bony tenderness and no laceration.       Legs: Neurological: She is alert and oriented to person, place, and time. She exhibits normal muscle tone. Coordination normal.  GCS 15. Speech is goal oriented. Patient moves extremities without ataxia   Skin: Skin is warm and dry. No rash noted. She is not diaphoretic. No erythema. No pallor.  Psychiatric: She has a normal mood and affect. Her behavior is normal.  Nursing note and vitals reviewed.   ED Course  Procedures (including critical care time) DIAGNOSTIC STUDIES: Oxygen Saturation is 100% on RA, normal by my interpretation.    COORDINATION OF CARE: 10:52 PM- Pt advised of plan for treatment which includes medication and pt agrees.  Labs Review Labs Reviewed - No data to display  Imaging Review No results found.   EKG Interpretation None       INCISION AND DRAINAGE Performed by: Jasmine Gentry, Yavonne Kiss Consent: Verbal consent obtained. Risks and benefits: risks, benefits and alternatives were discussed Type: abscess  Body area: posterior L thigh  Anesthesia: local infiltration  Incision was made with a scalpel.  Local anesthetic: lidocaine 2% with epinephrine  Anesthetic total: 5 ml  Complexity: complex Blunt dissection to break up loculations  Drainage: purulent  Drainage amount: moderate  Packing material: none  Patient tolerance: Patient tolerated the procedure well with no immediate complications.    MDM   Final diagnoses:  Abscess of left thigh    Patient with skin abscess amenable to incision and drainage.  Abscess was not large enough to warrant packing or drain, wound recheck advised. Encouraged home warm soaks and flushing. Mild signs of cellulitis is surrounding skin. Will d/c to home. No antibiotic therapy is indicated. Return precautions given. Patient agreeable to plan with no unaddressed concerns.  I personally performed the services described in this documentation, which was scribed in my presence. The recorded information has been reviewed and is accurate.   Filed Vitals:   12/30/14 2221  BP: 150/85  Pulse: 103  Temp: 98.5 F (36.9 C)  TempSrc: Oral  Resp: 18  SpO2: 100%       Jasmine MaduraKelly Rudolph Daoust, PA-C 12/31/14 0003  Zadie Rhineonald  Wickline, MD 12/31/14 1425

## 2014-12-30 NOTE — ED Notes (Signed)
Pt c/o "boil" on L posterior upper thigh since Tuesday. Pt has hx of boils. Pt sts "I can't handle the pain anymore." Pt A&Ox4 and ambulatory. Pt denies drainage from site, even after attempting to "pop" it.

## 2015-01-24 ENCOUNTER — Telehealth: Payer: Self-pay | Admitting: *Deleted

## 2015-01-24 NOTE — Telephone Encounter (Signed)
Discussed with a provider can give her a prescription for sprintec if desired for 1-2 months to last until she comes in for her annual exam which was due in May.  There is no generic for sprintec.  Called [facility] and left a message we are returning her call, would like her to schedule her annual exam , but we can give her a prescription- would like her to call us back and tell us best number and time to reach her to discuss with her.

## 2015-01-24 NOTE — Telephone Encounter (Signed)
Jasmine Gentry left a message that she is taking nuvaring to regulate her cycle and she is paying $46 and is calling to see if the doctor can prescribe a generic nuvaring or something less expensive.

## 2015-01-25 NOTE — Telephone Encounter (Signed)
Called patient, no answer- left message stating we are trying to reach you to return your phone call, please call us back at the clinics 

## 2015-02-24 ENCOUNTER — Telehealth: Payer: Self-pay | Admitting: *Deleted

## 2015-02-24 NOTE — Telephone Encounter (Signed)
Received voicemail message on nurse line left by patient on 02/23/15 at 1717.  Patient states she needs a refill on her nuvaring or something cheaper that she can afford.  States her nuvaring was supposed to go back in on 02/22/15 and her medication is still not at her pharmacy.  Requesting a call back.

## 2015-02-25 NOTE — Telephone Encounter (Signed)
Called patient, no answer- left message stating we are trying to reach you to return your phone call, please call us back at the clinics 

## 2015-03-02 NOTE — Telephone Encounter (Signed)
Called patient, no answer- unable to leave message due to message stating person is not accepting incoming calls at this time

## 2015-03-31 ENCOUNTER — Emergency Department (HOSPITAL_COMMUNITY): Payer: No Typology Code available for payment source

## 2015-03-31 ENCOUNTER — Emergency Department (HOSPITAL_COMMUNITY)
Admission: EM | Admit: 2015-03-31 | Discharge: 2015-03-31 | Disposition: A | Discharge status: Home / Self Care | Payer: No Typology Code available for payment source | Attending: Emergency Medicine | Admitting: Emergency Medicine

## 2015-03-31 ENCOUNTER — Encounter (HOSPITAL_COMMUNITY): Payer: Self-pay | Admitting: *Deleted

## 2015-03-31 DIAGNOSIS — Y9389 Activity, other specified: Secondary | ICD-10-CM | POA: Insufficient documentation

## 2015-03-31 DIAGNOSIS — S3992XA Unspecified injury of lower back, initial encounter: Secondary | ICD-10-CM | POA: Insufficient documentation

## 2015-03-31 DIAGNOSIS — Z8619 Personal history of other infectious and parasitic diseases: Secondary | ICD-10-CM | POA: Insufficient documentation

## 2015-03-31 DIAGNOSIS — Y92410 Unspecified street and highway as the place of occurrence of the external cause: Secondary | ICD-10-CM | POA: Diagnosis not present

## 2015-03-31 DIAGNOSIS — Y998 Other external cause status: Secondary | ICD-10-CM | POA: Insufficient documentation

## 2015-03-31 DIAGNOSIS — M545 Low back pain, unspecified: Secondary | ICD-10-CM

## 2015-03-31 MED ORDER — HYDROCODONE-ACETAMINOPHEN 5-325 MG PO TABS
1.0000 | ORAL_TABLET | Freq: Once | ORAL | Status: AC
  Administered 2015-03-31: 1 via ORAL
  Filled 2015-03-31: qty 1

## 2015-03-31 MED ORDER — METHOCARBAMOL 500 MG PO TABS: 500.0000 mg | ORAL_TABLET | Freq: Two times a day (BID) | ORAL | Status: DC

## 2015-03-31 MED ORDER — ACETAMINOPHEN 500 MG PO TABS: 500.0000 mg | ORAL_TABLET | Freq: Four times a day (QID) | ORAL | Status: DC | PRN

## 2015-03-31 NOTE — Discharge Instructions (Signed)
Back Injury Prevention The following tips can help you to prevent a back injury. PHYSICAL FITNESS  Exercise often. Try to develop strong stomach (abdominal) muscles.  Do aerobic exercises often. This includes walking, jogging, biking, swimming.  Do exercises that help with balance and strength often. This includes tai chi and yoga.  Stretch before and after you exercise.  Keep a healthy weight. DIET   Ask your doctor how much calcium and vitamin D you need every day.  Include calcium in your diet. Foods high in calcium include dairy products; green, leafy vegetables; and products with calcium added (fortified).  Include vitamin D in your diet. Foods high in vitamin D include milk and products with vitamin D added.  Think about taking a multivitamin or other nutritional products called " supplements."  Stop smoking if you smoke. POSTURE   Sit and stand up straight. Avoid leaning forward or hunching over.  Choose chairs that support your lower back.  If you work at a desk:  Sit close to your work so you do not lean over.  Keep your chin tucked in.  Keep your neck drawn back.  Keep your elbows bent at a right angle. Your arms should look like the letter "L."  Sit high and close to the steering wheel when you drive. Add low back support to your car seat if needed.  Avoid sitting or standing in one position for too long. Get up and move around every hour. Take breaks if you are driving for a long time.  Sleep on your side with your knees slightly bent. You can also sleep on your back with a pillow under your knees. Do not sleep on your stomach. LIFTING, TWISTING, AND REACHING  Avoid heavy lifting, especially lifting over and over again. If you must do heavy lifting:  Stretch before lifting.  Work slowly.  Rest between lifts.  Use carts and dollies to move objects when possible.  Make several small trips instead of carrying 1 heavy load.  Ask for help when you  need it.  Ask for help when moving big, awkward objects.  Follow these steps when lifting:  Stand with your feet shoulder-width apart.  Get as close to the object as you can. Do not pick up heavy objects that are far from your body.  Use handles or lifting straps when possible.  Bend at your knees. Squat down, but keep your heels off the floor.  Keep your shoulders back, your chin tucked in, and your back straight.  Lift the object slowly. Tighten the muscles in your legs, stomach, and butt. Keep the object as close to the center of your body as possible.  Reverse these directions when you put a load down.  Do not:  Lift the object above your waist.  Twist at the waist while lifting or carrying a load. Move your feet if you need to turn, not your waist.  Bend over without bending at your knees.  Avoid reaching over your head, across a table, or for an object on a high surface. OTHER TIPS  Avoid wet floors and keep sidewalks clear of ice.  Do not sleep on a mattress that is too soft or too hard.  Keep items that you use often within easy reach.  Put heavier objects on shelves at waist level. Put lighter objects on lower or higher shelves.  Find ways to lessen your stress. You can try exercise, massage, or relaxation.  Get help for depression or anxiety if  needed. GET HELP IF:  You injure your back.  You have questions about diet, exercise, or other ways to prevent back injuries. MAKE SURE YOU:  Understand these instructions.  Will watch your condition.  Will get help right away if you are not doing well or get worse. Document Released: 01/23/2008 Document Revised: 10/29/2011 Document Reviewed: 09/17/2011 [facility] Patient Information 2015 [place], [place]. This information is not intended to replace advice given to you by your health care provider. Make sure you discuss any questions you have with your health care provider.  Back Pain, Adult Low back pain is  very common. About 1 in 5 people have back pain.The cause of low back pain is rarely dangerous. The pain often gets better over time.About half of people with a sudden onset of back pain feel better in just 2 weeks. About 8 in 10 people feel better by 6 weeks.  CAUSES Some common causes of back pain include:  Strain of the muscles or ligaments supporting the spine.  Wear and tear (degeneration) of the spinal discs.  Arthritis.  Direct injury to the back. DIAGNOSIS Most of the time, the direct cause of low back pain is not known.However, back pain can be treated effectively even when the exact cause of the pain is unknown.Answering your caregiver's questions about your overall health and symptoms is one of the most accurate ways to make sure the cause of your pain is not dangerous. If your caregiver needs more information, he or she may order lab work or imaging tests (X-rays or MRIs).However, even if imaging tests show changes in your back, this usually does not require surgery. HOME CARE INSTRUCTIONS For many people, back pain returns.Since low back pain is rarely dangerous, it is often a condition that people can learn to [facility] their own.   Remain active. It is stressful on the back to sit or stand in one place. Do not sit, drive, or stand in one place for more than 30 minutes at a time. Take short walks on level surfaces as soon as pain allows.Try to increase the length of time you walk each day.  Do not stay in bed.Resting more than 1 or 2 days can delay your recovery.  Do not avoid exercise or work.Your body is made to move.It is not dangerous to be active, even though your back may hurt.Your back will likely heal faster if you return to being active before your pain is gone.  Pay attention to your body when you bend and lift. Many people have less discomfortwhen lifting if they bend their knees, keep the load close to their bodies,and avoid twisting. Often, the most  comfortable positions are those that put less stress on your recovering back.  Find a comfortable position to sleep. Use a firm mattress and lie on your side with your knees slightly bent. If you lie on your back, put a pillow under your knees.  Only take over-the-counter or prescription medicines as directed by your caregiver. Over-the-counter medicines to reduce pain and inflammation are often the most helpful.Your caregiver may prescribe muscle relaxant drugs.These medicines help dull your pain so you can more quickly return to your normal activities and healthy exercise.  Put ice on the injured area.  Put ice in a plastic bag.  Place a towel between your skin and the bag.  Leave the ice on for 15-20 minutes, 03-04 times a day for the first 2 to 3 days. After that, ice and heat may be alternated  to reduce pain and spasms.  Ask your caregiver about trying back exercises and gentle massage. This may be of some benefit.  Avoid feeling anxious or stressed.Stress increases muscle tension and can worsen back pain.It is important to recognize when you are anxious or stressed and learn ways to manage it.Exercise is a great option. SEEK MEDICAL CARE IF:  You have pain that is not relieved with rest or medicine.  You have pain that does not improve in 1 week.  You have new symptoms.  You are generally not feeling well. SEEK IMMEDIATE MEDICAL CARE IF:   You have pain that radiates from your back into your legs.  You develop new bowel or bladder control problems.  You have unusual weakness or numbness in your arms or legs.  You develop nausea or vomiting.  You develop abdominal pain.  You feel faint. Document Released: 08/06/2005 Document Revised: 02/05/2012 Document Reviewed: 12/08/2013 [facility] Patient Information 2015 [place], [place]. This information is not intended to replace advice given to you by your health care provider. Make sure you discuss any questions you have with  your health care provider.  Take ibuprofen and muscle relaxer as need for pain and for muscle spasms. No fracture noted.
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
## 2016-05-15 IMAGING — CR DG CHEST 2V
2 series · 2 of 2 positions shown · non-contrast
Comparison: 06/16/2013

CLINICAL DATA: Upper respiratory infection

EXAM:
CHEST  2 VIEW

[w chest pa]
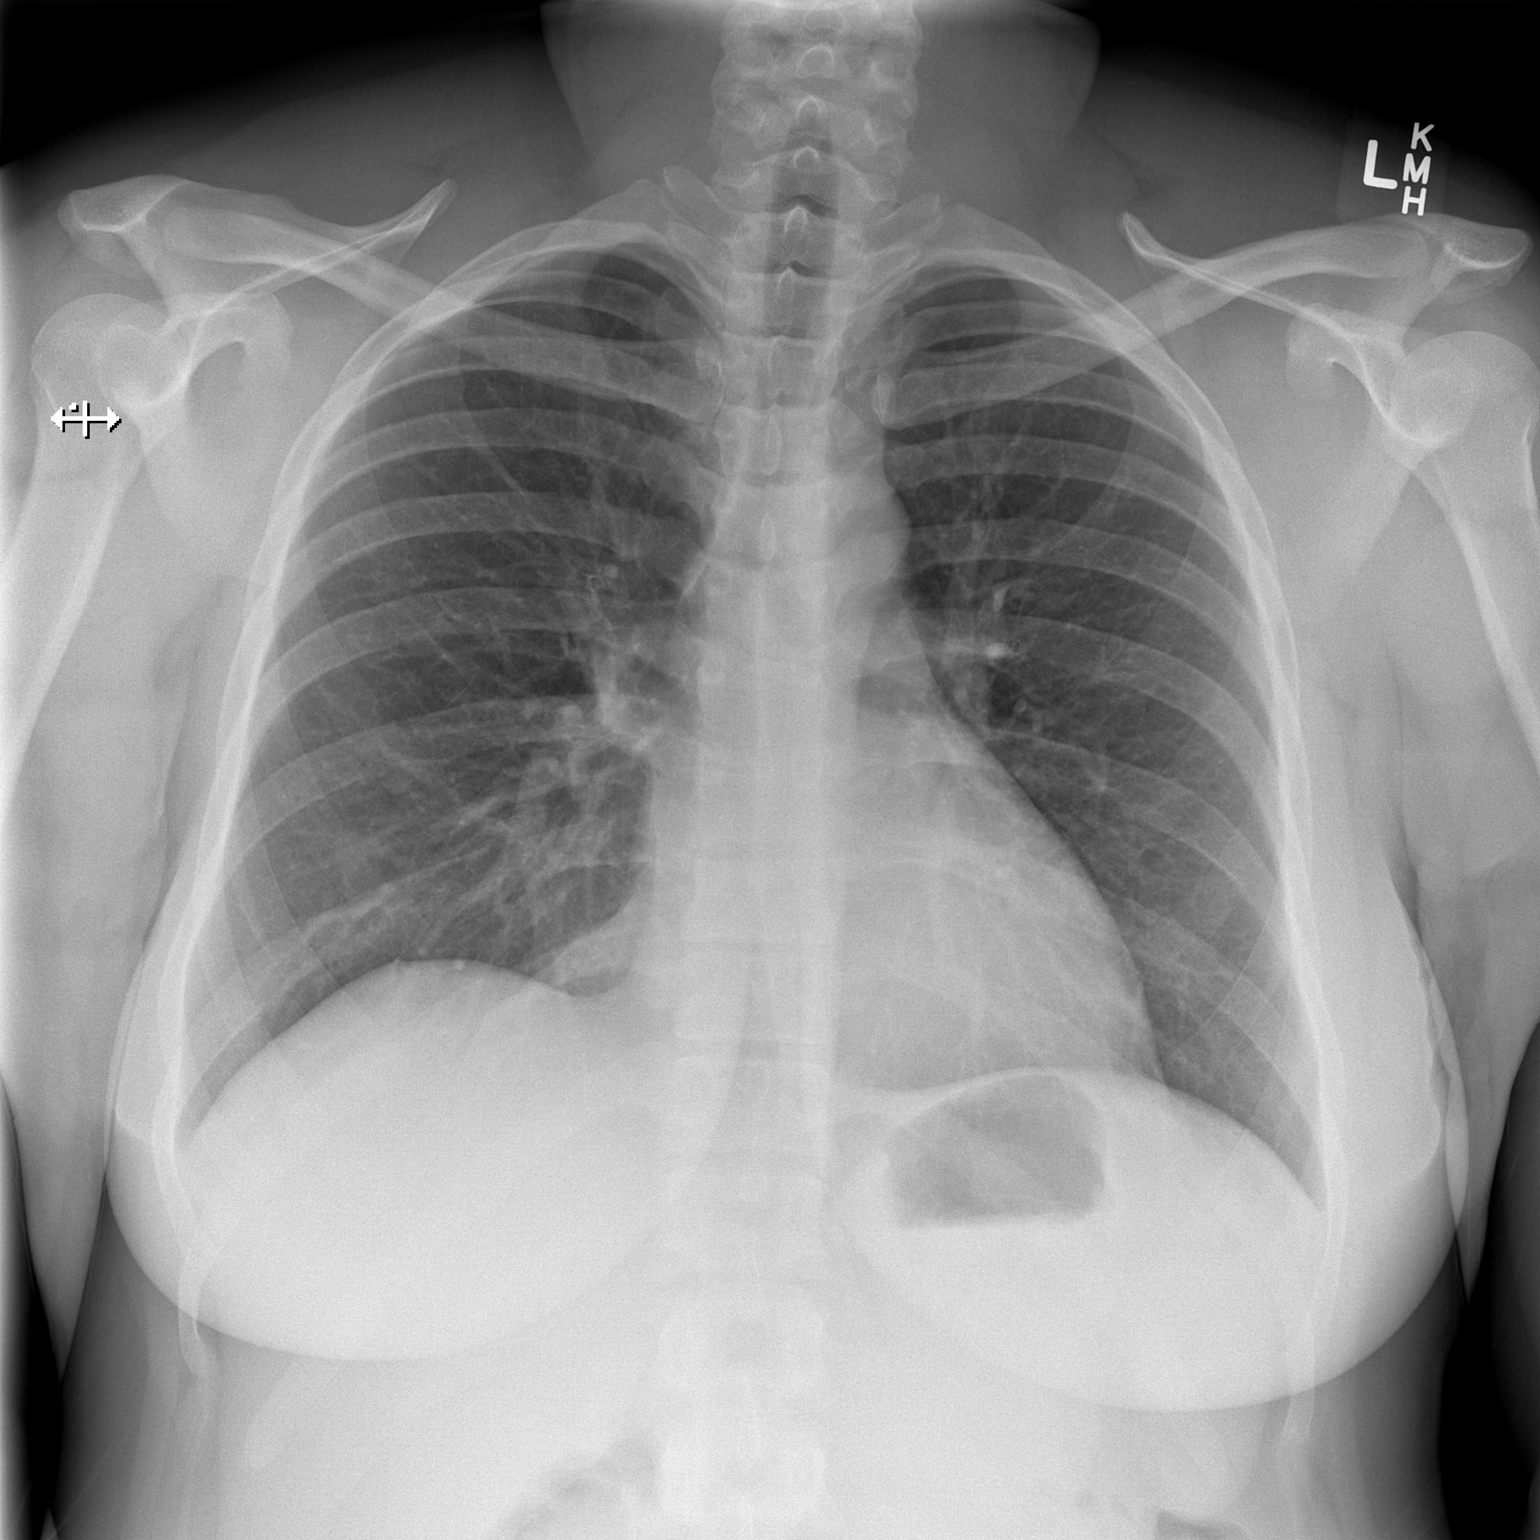

[w chest lat]
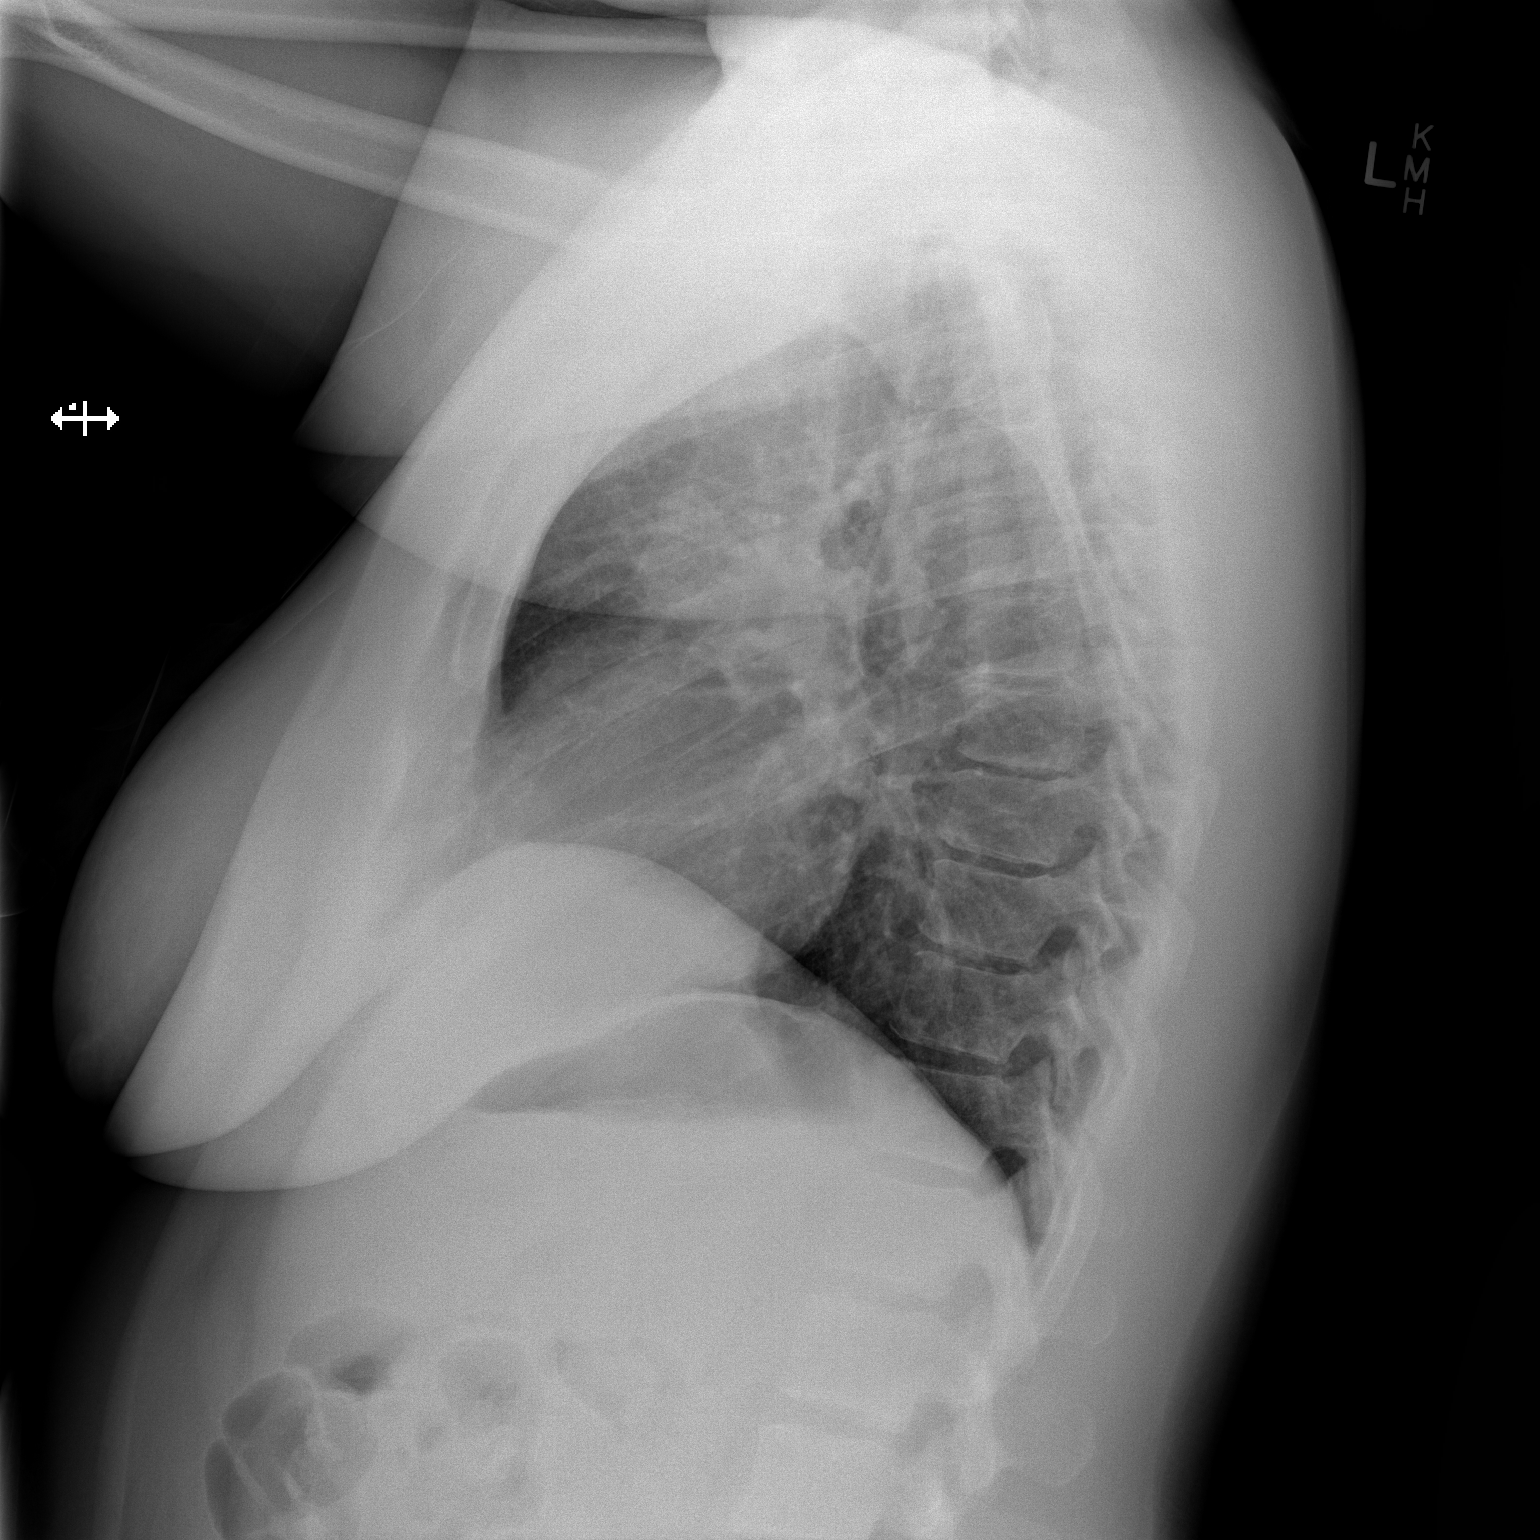

[2 of 2 positions shown; findings below may reference images not displayed]

FINDINGS: Normal heart size and mediastinal contours. No acute infiltrate or
edema. No effusion or pneumothorax. No acute osseous findings.
IMPRESSION: Negative chest.

## 2016-08-03 ENCOUNTER — Other Ambulatory Visit (HOSPITAL_BASED_OUTPATIENT_CLINIC_OR_DEPARTMENT_OTHER): Payer: Self-pay

## 2016-08-03 DIAGNOSIS — R5383 Other fatigue: Secondary | ICD-10-CM

## 2016-08-03 DIAGNOSIS — R0683 Snoring: Secondary | ICD-10-CM

## 2016-08-05 ENCOUNTER — Ambulatory Visit (HOSPITAL_BASED_OUTPATIENT_CLINIC_OR_DEPARTMENT_OTHER): Payer: No Typology Code available for payment source

## 2016-08-17 ENCOUNTER — Ambulatory Visit (HOSPITAL_BASED_OUTPATIENT_CLINIC_OR_DEPARTMENT_OTHER): Payer: BLUE CROSS/BLUE SHIELD | Attending: Physician Assistant | Admitting: Internal Medicine

## 2016-08-17 VITALS — Ht 68.0 in | Wt 210.0 lb

## 2016-08-17 DIAGNOSIS — R5383 Other fatigue: Secondary | ICD-10-CM | POA: Diagnosis not present

## 2016-08-17 DIAGNOSIS — E669 Obesity, unspecified: Secondary | ICD-10-CM | POA: Insufficient documentation

## 2016-08-17 DIAGNOSIS — R0683 Snoring: Secondary | ICD-10-CM | POA: Diagnosis not present

## 2016-08-17 DIAGNOSIS — G4733 Obstructive sleep apnea (adult) (pediatric): Secondary | ICD-10-CM | POA: Diagnosis not present

## 2016-08-17 DIAGNOSIS — Z6832 Body mass index (BMI) 32.0-32.9, adult: Secondary | ICD-10-CM | POA: Insufficient documentation

## 2016-08-17 DIAGNOSIS — G4761 Periodic limb movement disorder: Secondary | ICD-10-CM | POA: Diagnosis not present

## 2016-08-21 ENCOUNTER — Other Ambulatory Visit (HOSPITAL_BASED_OUTPATIENT_CLINIC_OR_DEPARTMENT_OTHER): Payer: Self-pay

## 2016-08-21 DIAGNOSIS — R5383 Other fatigue: Secondary | ICD-10-CM

## 2016-08-21 DIAGNOSIS — R0683 Snoring: Secondary | ICD-10-CM

## 2016-08-25 DIAGNOSIS — G4733 Obstructive sleep apnea (adult) (pediatric): Secondary | ICD-10-CM

## 2016-08-25 NOTE — Procedures (Signed)
  Patient Name: Jasmine Gentry, Evianna Study Date: 08/17/2016 Gender: Female D.O.B: [birth year] Age (years): 40 Referring Provider: Norva RiffleAshley Vanstory Height (inches): 68 Interpreting Physician: Jetty Duhamellinton Deep Bonawitz MD, ABSM Weight (lbs): 210 RPSGT: Celene KrasCharles, Nicole BMI: 32 MRN: [id number] Neck Size: 15.00 CLINICAL INFORMATION Sleep Study Type: NPSG   Indication for sleep study: Fatigue, Obesity, Snoring  Epworth Sleepiness Score: 3  SLEEP STUDY TECHNIQUE As per the AASM Manual for the Scoring of Sleep and Associated Events v2.3 (April 2016) with a hypopnea requiring 4% desaturations.  The channels recorded and monitored were frontal, central and occipital EEG, electrooculogram (EOG), submentalis EMG (chin), nasal and oral airflow, thoracic and abdominal wall motion, anterior tibialis EMG, snore microphone, electrocardiogram, and pulse oximetry.  MEDICATIONS Medications self-administered by patient taken the night of the study : none reported  SLEEP ARCHITECTURE The study was initiated at 10:53:43 PM and ended at 4:55:39 AM.  Sleep onset time was 0.0 minutes and the sleep efficiency was 89.1%. The total sleep time was 322.4 minutes.  Stage REM latency was 76.0 minutes.  The patient spent 5.74% of the night in stage N1 sleep, 75.66% in stage N2 sleep, 2.48% in stage N3 and 16.13% in REM.  Alpha intrusion was absent.  Supine sleep was 11.32%.  RESPIRATORY PARAMETERS The overall apnea/hypopnea index (AHI) was 0.2 per hour. There were 0 total apneas, including 0 obstructive, 0 central and 0 mixed apneas. There were 1 hypopneas and 8 RERAs.  The AHI during Stage REM sleep was 1.2 per hour.  AHI while supine was 1.6 per hour.  The mean oxygen saturation was 96.44%. The minimum SpO2 during sleep was 93.00%.  Loud snoring was noted during this study.  CARDIAC DATA The 2 lead EKG demonstrated sinus rhythm. The mean heart rate was 76.82 beats per minute. Other EKG findings include:  None.  LEG MOVEMENT DATA The total PLMS were 66 with a resulting PLMS index of 12.28. Associated arousal with leg movement index was 5.6 .  IMPRESSIONS - No significant obstructive sleep apnea occurred during this study (AHI = 0.2/h). - No significant central sleep apnea occurred during this study (CAI = 0.0/h). - The patient had minimal or no oxygen desaturation during the study (Min O2 = 93.00%) - The patient snored with Loud snoring volume. - No cardiac abnormalities were noted during this study. - Mild periodic limb movements of sleep occurred during the study. Associated arousals were significant.  DIAGNOSIS - Periodic Limb Movement Syndrome (327.51 [G47.61 ICD-10]) - Primary Snoring (786.09 [R06.83 ICD-10])  RECOMMENDATIONS - Limb movement sleep disturbance was mild- consider if treatment would be clinically useful. - Patient asleep almost immediately, with no medication reported. Consider recent sleep deprivation, or unreported medication. - Avoid alcohol, sedatives and other CNS depressants that may worsen sleep apnea and disrupt normal sleep architecture. - Sleep hygiene should be reviewed to assess factors that may improve sleep quality. - Weight management and regular exercise should be initiated or continued if appropriate.  [Electronically signed] 08/25/2016 09:24 AM  Jetty Duhamellinton Bijou Easler MD, ABSM Diplomate, American Board of Sleep Medicine   NPI: [id number]  Waymon BudgeYOUNG,Purity Irmen D Diplomate, American Board of Sleep Medicine  ELECTRONICALLY SIGNED ON:  08/25/2016, 9:18 AM [facility] PH: [phone]   FX: [phone] ACCREDITED BY THE AMERICAN ACADEMY OF SLEEP MEDICINE

## 2016-12-10 ENCOUNTER — Other Ambulatory Visit (HOSPITAL_BASED_OUTPATIENT_CLINIC_OR_DEPARTMENT_OTHER): Payer: Self-pay | Admitting: Physician Assistant

## 2016-12-10 ENCOUNTER — Emergency Department (HOSPITAL_COMMUNITY)
Admission: EM | Admit: 2016-12-10 | Discharge: 2016-12-10 | Disposition: A | Discharge status: Home / Self Care | Payer: BC Managed Care – PPO | Attending: Dermatology | Admitting: Dermatology

## 2016-12-10 ENCOUNTER — Encounter (HOSPITAL_COMMUNITY): Payer: Self-pay | Admitting: *Deleted

## 2016-12-10 ENCOUNTER — Ambulatory Visit (HOSPITAL_BASED_OUTPATIENT_CLINIC_OR_DEPARTMENT_OTHER)
Admission: RE | Admit: 2016-12-10 | Discharge: 2016-12-10 | Disposition: A | Discharge status: Home / Self Care | Payer: Self-pay | Source: Ambulatory Visit | Attending: Physician Assistant | Admitting: Physician Assistant

## 2016-12-10 DIAGNOSIS — Z5321 Procedure and treatment not carried out due to patient leaving prior to being seen by health care provider: Secondary | ICD-10-CM | POA: Diagnosis not present

## 2016-12-10 DIAGNOSIS — M79602 Pain in left arm: Secondary | ICD-10-CM | POA: Insufficient documentation

## 2016-12-10 NOTE — ED Notes (Signed)
Bed: WA19 Expected date:  Expected time:  Means of arrival:  Comments: 

## 2016-12-10 NOTE — ED Triage Notes (Signed)
Pt states onset of left arm pain around 8pm, denies injury. Pain feels better when she holds it, worse when she moves it. Describes this intermittent pain for "several years". Pain usually last several hours, sometimes relieved by heat and then goes away. This time seems to be lasting longer.

## 2016-12-10 NOTE — ED Notes (Signed)
Called name, but no response from the waiting room. 

## 2016-12-10 NOTE — ED Notes (Signed)
Name called to lobby 3 times with no response.

## 2017-08-17 ENCOUNTER — Inpatient Hospital Stay (HOSPITAL_COMMUNITY)
Admission: AD | Admit: 2017-08-17 | Discharge: 2017-08-17 | Disposition: A | Discharge status: Home / Self Care | Payer: BC Managed Care – PPO | Source: Ambulatory Visit | Attending: Obstetrics and Gynecology | Admitting: Obstetrics and Gynecology

## 2017-08-17 ENCOUNTER — Encounter (HOSPITAL_COMMUNITY): Payer: Self-pay

## 2017-08-17 ENCOUNTER — Other Ambulatory Visit: Payer: Self-pay

## 2017-08-17 DIAGNOSIS — Z9851 Tubal ligation status: Secondary | ICD-10-CM | POA: Insufficient documentation

## 2017-08-17 DIAGNOSIS — L739 Follicular disorder, unspecified: Secondary | ICD-10-CM

## 2017-08-17 DIAGNOSIS — Z3202 Encounter for pregnancy test, result negative: Secondary | ICD-10-CM | POA: Insufficient documentation

## 2017-08-17 DIAGNOSIS — Z8619 Personal history of other infectious and parasitic diseases: Secondary | ICD-10-CM | POA: Insufficient documentation

## 2017-08-17 DIAGNOSIS — Z9889 Other specified postprocedural states: Secondary | ICD-10-CM | POA: Diagnosis not present

## 2017-08-17 DIAGNOSIS — N898 Other specified noninflammatory disorders of vagina: Secondary | ICD-10-CM

## 2017-08-17 DIAGNOSIS — N888 Other specified noninflammatory disorders of cervix uteri: Secondary | ICD-10-CM

## 2017-08-17 LAB — URINALYSIS, ROUTINE W REFLEX MICROSCOPIC
Bilirubin Urine: NEGATIVE
Glucose, UA: NEGATIVE mg/dL
Hgb urine dipstick: NEGATIVE
Ketones, ur: NEGATIVE mg/dL
Nitrite: NEGATIVE
Protein, ur: NEGATIVE mg/dL
Specific Gravity, Urine: 1.005 (ref 1.005–1.030)
pH: 6 (ref 5.0–8.0)

## 2017-08-17 LAB — POCT PREGNANCY, URINE: PREG TEST UR: NEGATIVE

## 2017-08-17 LAB — WET PREP, GENITAL
Clue Cells Wet Prep HPF POC: NONE SEEN
SPERM: NONE SEEN
Trich, Wet Prep: NONE SEEN
YEAST WET PREP: NONE SEEN

## 2017-08-17 NOTE — MAU Note (Signed)
2 months ago started using a vag probiotic.  2 wks later had a heavy d/c, stopped using it.  Went to her dr, was dx with a vaginal inf- was given an antibotic cream, after that she started itching, was given an oral pill for yeast.  Still having some d/c, some days it is heavy, some days not.  Now has noted a knot on 'her lip'. Still having d/c and irritation.  Also has noted knots in the creases (groin), they are tender to touch.

## 2017-08-17 NOTE — MAU Provider Note (Signed)
Chief Complaint: Vaginal Discharge and Vaginal Itching   None     SUBJECTIVE HPI: Jasmine Gentry is a 41 y.o. [phone] who presents to maternity admissions reporting vaginal discharge and bump in vaginal area.  She reports she started a probiotic tablet 2 months ago. Within 2 weeks she developed a heavy vaginal discharge that did not have an odor and was not associated with itching/irritation.  She went to her primary care doctor, was treated for BV, started having itching, called her PCP and was prescribed Diflucan for yeast.  She is no longer itching but still reports ongoing discharge but she has not taken the probiotic in weeks.  She has not tried any other treatments. She denies risks for STDs and reports monogamy with her husband but she did have STD testing in the office recently which was all negative.  Yesterday she noticed a bump on her labia that was painful. She has not tried any treatments. She has never had this before. She came to MAU to have all of this evaluated. She has no abdominal or pelvic pain. There are no other associated symptoms. She denies vaginal bleeding, vaginal itching/burning, urinary symptoms, h/a, dizziness, n/v, or fever/chills.     HPI  Past Medical History:  Diagnosis Date  . Chlamydia   . AVWUJWJX(914.7Headache(784.0)    Past Surgical History:  Procedure Laterality Date  . carpel tunnel release    . NASAL SINUS SURGERY N/A 01/30/2014   Procedure: IRRIGATION AND DEBRIDEMENT NASAL SEPTAL ABSCESS;  Surgeon: Flo ShanksKarol Wolicki, MD;  [facility];  Service: ENT;  Laterality: N/A;  . TONSILLECTOMY    . TUBAL LIGATION     Social History   Socioeconomic History  . Marital status: Single    Spouse name: Not on file  . Number of children: Not on file  . Years of education: Not on file  . Highest education level: Not on file  Social Needs  . Financial resource strain: Not on file  . Food insecurity - worry: Not on file  . Food insecurity - inability: Not on file  .  Transportation needs - medical: Not on file  . Transportation needs - non-medical: Not on file  Occupational History  . Not on file  Tobacco Use  . Smoking status: Never Smoker  . Smokeless tobacco: Never Used  Substance and Sexual Activity  . Alcohol use: Yes    Comment: occ.  . Drug use: No  . Sexual activity: Yes    Birth control/protection: Surgical    Comment: last intercourse 3-4 wks ago  Other Topics Concern  . Not on file  Social History Narrative  . Not on file   No current facility-administered medications on file prior to encounter.    Current Outpatient Medications on File Prior to Encounter  Medication Sig Dispense Refill  . cyclobenzaprine (FLEXERIL) 5 MG tablet Take 5 mg by mouth 3 (three) times daily as needed for muscle spasms.   3  . hydrOXYzine (ATARAX/VISTARIL) 25 MG tablet Take 25 mg by mouth at bedtime.    Marland Kitchen. ibuprofen (ADVIL,MOTRIN) 200 MG tablet Take 400-600 mg by mouth every 6 (six) hours as needed for moderate pain.     No Known Allergies  ROS:  Review of Systems  Constitutional: Negative for chills, fatigue and fever.  Respiratory: Negative for shortness of breath.   Cardiovascular: Negative for chest pain.  Gastrointestinal: Negative for abdominal pain, nausea and vomiting.  Genitourinary: Positive for vaginal discharge. Negative for difficulty urinating,  dysuria, flank pain, pelvic pain, vaginal bleeding and vaginal pain.  Musculoskeletal: Negative for back pain.  Skin: Positive for wound.  Neurological: Negative for dizziness and headaches.  Psychiatric/Behavioral: Negative.      I have reviewed patient's Past Medical Hx, Surgical Hx, Family Hx, Social Hx, medications and allergies.   Physical Exam   Patient Vitals for the past 24 hrs:  Temp Temp src Pulse Resp SpO2 Weight  08/17/17 1233 98.3 F (36.8 C) Oral 94 16 99 % 233 lb 12 oz (106 kg)   Constitutional: Well-developed, well-nourished female in no acute distress.  Cardiovascular:  normal rate Respiratory: normal effort GI: Abd soft, non-tender. Pos BS x 4 MS: Extremities nontender, no edema, normal ROM Neurologic: Alert and oriented x 4.  GU: Neg CVAT.  PELVIC EXAM: cervix with some mild erythema, friable to cotton swab, moderate amount thin white discharge, vaginal walls normal, small, 1 cm x 1 cm round smooth mass palpable in right labia  Bimanual exam: Cervix 0/long/high, firm, anterior, neg CMT, uterus nontender, nonenlarged, adnexa without tenderness, enlargement, or mass   LAB RESULTS Results for orders placed or performed during the hospital encounter of 08/17/17 (from the past 24 hour(s))  Urinalysis, Routine w reflex microscopic     Status: Abnormal   Collection Time: 08/17/17 12:34 PM  Result Value Ref Range   Color, Urine YELLOW YELLOW   APPearance CLEAR CLEAR   Specific Gravity, Urine 1.005 1.005 - 1.030   pH 6.0 5.0 - 8.0   Glucose, UA NEGATIVE NEGATIVE mg/dL   Hgb urine dipstick NEGATIVE NEGATIVE   Bilirubin Urine NEGATIVE NEGATIVE   Ketones, ur NEGATIVE NEGATIVE mg/dL   Protein, ur NEGATIVE NEGATIVE mg/dL   Nitrite NEGATIVE NEGATIVE   Leukocytes, UA LARGE (A) NEGATIVE   RBC / HPF 0-5 0 - 5 RBC/hpf   WBC, UA TOO NUMEROUS TO COUNT 0 - 5 WBC/hpf   Bacteria, UA RARE (A) NONE SEEN   Squamous Epithelial / LPF 0-5 (A) NONE SEEN   Mucus PRESENT   Pregnancy, urine POC     Status: None   Collection Time: 08/17/17 12:42 PM  Result Value Ref Range   Preg Test, Ur NEGATIVE NEGATIVE  Wet prep, genital     Status: Abnormal   Collection Time: 08/17/17  1:40 PM  Result Value Ref Range   Yeast Wet Prep HPF POC NONE SEEN NONE SEEN   Trich, Wet Prep NONE SEEN NONE SEEN   Clue Cells Wet Prep HPF POC NONE SEEN NONE SEEN   WBC, Wet Prep HPF POC MANY (A) NONE SEEN   Sperm NONE SEEN        IMAGING No results found.  MAU Management/MDM: Ordered labs and reviewed results.  No evidence of vaginal infection but increased WBCs.  WBCs in urine also but pt  without symptoms.  GCC pending but pt with low risk.Cervix friable but pt with history of cervicitis previously and some intermittent postcoital spotting is normal for her. Pt denies use of latex condoms, douches, or other possible irritants to the cervix. Discharge may still be change in vaginal flora from probiotic.  Bump on right labia is c/w folliculitis and is improving without treatment.  Pt to stay off probiotic, use breathable cotton underwear, avoid strong/perfumed soaps.  [facility] as desired for gyn care. Return to MAU as needed for emergencies.  Pt discharged with strict infection precautions.  ASSESSMENT 1. Vaginal discharge   2. Folliculitis   3. Friable cervix  PLAN Discharge home Allergies as of 08/17/2017   No Known Allergies     Medication List    TAKE these medications   cyclobenzaprine 5 MG tablet Commonly known as:  FLEXERIL Take 5 mg by mouth 3 (three) times daily as needed for muscle spasms.   hydrOXYzine 25 MG tablet Commonly known as:  ATARAX/VISTARIL Take 25 mg by mouth at bedtime.   ibuprofen 200 MG tablet Commonly known as:  ADVIL,MOTRIN Take 400-600 mg by mouth every 6 (six) hours as needed for moderate pain.      Follow-up Information    [facility].   Specialty:  Obstetrics and Gynecology Why:  For routine Gyn care, return to MAU as needed for emergencies Contact information: [address] [place] [address] [phone]          Sharen Counter Certified Nurse-Midwife 08/17/2017  3:26 PM

## 2017-08-19 LAB — GC/CHLAMYDIA PROBE AMP (~~LOC~~) NOT AT ARMC
Chlamydia: NEGATIVE
NEISSERIA GONORRHEA: NEGATIVE

## 2020-11-10 ENCOUNTER — Other Ambulatory Visit: Payer: Self-pay

## 2020-11-10 ENCOUNTER — Ambulatory Visit: Payer: BC Managed Care – PPO | Admitting: Family Medicine

## 2020-11-10 ENCOUNTER — Encounter: Payer: Self-pay | Admitting: Family Medicine

## 2020-11-10 VITALS — BP 124/76 | HR 84 | Ht 67.5 in | Wt 235.8 lb

## 2020-11-10 DIAGNOSIS — N920 Excessive and frequent menstruation with regular cycle: Secondary | ICD-10-CM

## 2020-11-10 DIAGNOSIS — Z13220 Encounter for screening for lipoid disorders: Secondary | ICD-10-CM | POA: Diagnosis not present

## 2020-11-10 DIAGNOSIS — Z0000 Encounter for general adult medical examination without abnormal findings: Secondary | ICD-10-CM

## 2020-11-10 DIAGNOSIS — Z1329 Encounter for screening for other suspected endocrine disorder: Secondary | ICD-10-CM

## 2020-11-10 DIAGNOSIS — Z7689 Persons encountering health services in other specified circumstances: Secondary | ICD-10-CM

## 2020-11-10 DIAGNOSIS — Z1159 Encounter for screening for other viral diseases: Secondary | ICD-10-CM

## 2020-11-10 DIAGNOSIS — Z1231 Encounter for screening mammogram for malignant neoplasm of breast: Secondary | ICD-10-CM

## 2020-11-10 DIAGNOSIS — E669 Obesity, unspecified: Secondary | ICD-10-CM

## 2020-11-10 DIAGNOSIS — F439 Reaction to severe stress, unspecified: Secondary | ICD-10-CM

## 2020-11-10 DIAGNOSIS — Z114 Encounter for screening for human immunodeficiency virus [HIV]: Secondary | ICD-10-CM

## 2020-11-10 LAB — LIPID PANEL

## 2020-11-10 LAB — COMPREHENSIVE METABOLIC PANEL

## 2020-11-10 LAB — CBC WITH DIFFERENTIAL/PLATELET
Hemoglobin: 13.3 g/dL (ref 11.1–15.9)
Immature Granulocytes: 0 %
Lymphocytes Absolute: 1.9 10*3/uL (ref 0.7–3.1)
Lymphs: 38 %
MCV: 89 fL (ref 79–97)
Monocytes Absolute: 0.4 10*3/uL (ref 0.1–0.9)

## 2020-11-10 LAB — HIV ANTIBODY (ROUTINE TESTING W REFLEX)

## 2020-11-10 NOTE — Patient Instructions (Addendum)
It was a pleasure meeting you today.   You will hear from [facility].   Call and schedule your mammogram and [facility].   Use good sleep hygiene as discussed including no TV or cell phone use in the bed.  Dark and cool room.  Let me know if you are still having issues.  I am ok with you taking melatonin 10 mg   You can call to schedule your appointment with the counselor. A few offices are listed below for you to call.    [place] [address] [place], [place] [address]  Phone: [facility]  [address], [place], [place] [address]  Phone: [phone]  [facility] [address] [place], [place] [address] [phone] for appointments   [facility]  [address] [place], [place] [address] [place]  (across from [place])  [place] Resident only [address] [place], [place] [address] [phone]    [facility] [address] [place],  [address] [phone]     Preventive Care 72-4 Years Old, Female Preventive care refers to lifestyle choices and visits with your health care provider that can promote health and wellness. This includes:  A yearly physical exam. This is also called an annual wellness visit.  Regular dental and eye exams.  Immunizations.  Screening for certain conditions.  Healthy lifestyle choices, such as: ? Eating a healthy diet. ? Getting regular exercise. ? Not using drugs or products that contain nicotine and tobacco. ? Limiting alcohol use. What can I expect for my preventive care visit? Physical exam Your health care provider will check your:  Height and weight. These may be used to calculate your BMI (body mass index). BMI  is a measurement that tells if you are at a healthy weight.  Heart rate and blood pressure.  Body temperature.  Skin for abnormal spots. Counseling Your health care provider may ask you questions about your:  Past medical problems.  Family's medical history.  Alcohol, tobacco, and drug use.  Emotional well-being.  Home life and relationship well-being.  Sexual activity.  Diet, exercise, and sleep habits.  Work and work Statistician.  Access to firearms.  Method of birth control.  Menstrual cycle.  Pregnancy history. What immunizations do I need? Vaccines are usually given at various ages, according to a schedule. Your health care provider will recommend vaccines for you based on your age, medical history, and lifestyle or other factors, such as travel or where you work.   What tests do I need? Blood tests  Lipid and cholesterol levels. These may be checked every 5 years, or more often if you are over 24 years old.  Hepatitis C test.  Hepatitis B test. Screening  Lung cancer screening. You may have this screening every year starting at age 16 if you have a 30-pack-year history of smoking and currently smoke or have quit within the past 15 years.  Colorectal cancer screening. ? All adults should have this screening starting at age 28 and continuing until age 69. ? Your health care provider may recommend screening at age 60 if you are at increased risk. ? You will have tests every 1-10 years, depending on your results and the type of screening test.  Diabetes screening. ?  This is done by checking your blood sugar (glucose) after you have not eaten for a while (fasting). ? You may have this done every 1-3 years.  Mammogram. ? This may be done every 1-2 years. ? Talk with your health care provider about when you should start having regular mammograms. This may depend on whether you have a family history of breast cancer.  BRCA-related cancer screening. This may  be done if you have a family history of breast, ovarian, tubal, or peritoneal cancers.  Pelvic exam and Pap test. ? This may be done every 3 years starting at age 43. ? Starting at age 14, this may be done every 5 years if you have a Pap test in combination with an HPV test. Other tests  STD (sexually transmitted disease) testing, if you are at risk.  Bone density scan. This is done to screen for osteoporosis. You may have this scan if you are at high risk for osteoporosis. Talk with your health care provider about your test results, treatment options, and if necessary, the need for more tests. Follow these instructions at home: Eating and drinking  Eat a diet that includes fresh fruits and vegetables, whole grains, lean protein, and low-fat dairy products.  Take vitamin and mineral supplements as recommended by your health care provider.  Do not drink alcohol if: ? Your health care provider tells you not to drink. ? You are pregnant, may be pregnant, or are planning to become pregnant.  If you drink alcohol: ? Limit how much you have to 0-1 drink a day. ? Be aware of how much alcohol is in your drink. In the [place] one drink equals one 12 oz bottle of beer (355 mL), one 5 oz glass of wine (148 mL), or one 1 oz glass of hard liquor (44 mL).   Lifestyle  Take daily care of your teeth and gums. Brush your teeth every morning and night with fluoride toothpaste. Floss one time each day.  Stay active. Exercise for at least 30 minutes 5 or more days each week.  Do not use any products that contain nicotine or tobacco, such as cigarettes, e-cigarettes, and chewing tobacco. If you need help quitting, ask your health care provider.  Do not use drugs.  If you are sexually active, practice safe sex. Use a condom or other form of protection to prevent STIs (sexually transmitted infections).  If you do not wish to become pregnant, use a form of birth control. If you plan to become pregnant,  see your health care provider for a prepregnancy visit.  If told by your health care provider, take low-dose aspirin daily starting at age 30.  Find healthy ways to cope with stress, such as: ? Meditation, yoga, or listening to music. ? Journaling. ? Talking to a trusted person. ? Spending time with friends and family. Safety  Always wear your seat belt while driving or riding in a vehicle.  Do not drive: ? If you have been drinking alcohol. Do not ride with someone who has been drinking. ? When you are tired or distracted. ? While texting.  Wear a helmet and other protective equipment during sports activities.  If you have firearms in your house, make sure you follow all gun safety procedures. What's next?  Visit your health care provider once a year for an annual wellness visit.  Ask your health care provider how often you should have your eyes and teeth checked.  Stay up to date on all  vaccines. This information is not intended to replace advice given to you by your health care provider. Make sure you discuss any questions you have with your health care provider. Document Revised: 05/10/2020 Document Reviewed: 04/17/2018 Elsevier Patient Education  2021 [employer].

## 2020-11-10 NOTE — Progress Notes (Signed)
Subjective:    Patient ID: Jasmine Gentry, female    DOB: 1975-09-05, 45 y.o.   MRN: [id number]  HPI Chief Complaint  Patient presents with  . other    New pt. Est.   . Annual Exam    New patient annual exam, patient is fasting. She is on her cycle so I didn't do a UA. Extremely periods 4 out of 7 days, would like to discuss. Feels like she lets thinks get to her-not sure what to call it, she says. Trouble staying asleep, falls alseep ok. No covid vaccines or flu shot and not interested today.    She is new to the practice and here for a complete physical exam. Previous medical care: [facility] in 2020  Last CPE: 2020  Other providers: None   Heavy menses for 4/7 days. Has been the case for over a year.  Nuvaring made her BP increase so she stopped using it. Does not want hormone therapy.    Complains of sleep issues. Trouble staying asleep. Sleeps for 5 hours per night on average. Feels rested when she wakes up.  She and her husband both snore. TV in on all night.  Occasionally smokes marijuana for sleep. Has tried melatonin.   OSA ruled out per sleep test in 2017  Reports increased stress at work and home. Feeling more irritable. Is not in counseling. No medication.   Social history: Lives with husband and children who are ages 54, 65, 71, works as a Child psychotherapist  Diet: healthy at time  Excerise: occasional   Immunizations: Covid   Health maintenance:  Mammogram: never  Colonoscopy: never  Last Gynecological Exam: 2020 Last Menstrual cycle: 11/06/2020 Last Dental Exam: 07/2020 Last Eye Exam: 2020 reading glasses   Wears seatbelt always, uses sunscreen, smoke detectors in home and functioning, does not text while driving and feels safe in home environment.   Reviewed allergies, medications, past medical, surgical, family, and social history.   Review of Systems Review of Systems Constitutional: -fever, -chills, -sweats, -unexpected weight  change,-fatigue ENT: -runny nose, -ear pain, -sore throat Cardiology:  -chest pain, -palpitations, -edema Respiratory: -cough, -shortness of breath, -wheezing Gastroenterology: -abdominal pain, -nausea, -vomiting, -diarrhea, -constipation  Hematology: -bleeding or bruising problems Musculoskeletal: -arthralgias, -myalgias, -joint swelling, -back pain Ophthalmology: -vision changes Urology: -dysuria, -difficulty urinating, -hematuria, -urinary frequency, -urgency Neurology: -headache, -weakness, -tingling, -numbness       Objective:   Physical Exam BP 124/76   Pulse 84   Ht 5' 7.5" (1.715 m)   Wt 235 lb 12.8 oz (107 kg)   LMP 11/06/2020   BMI 36.39 kg/m   General Appearance:    Alert, cooperative, no distress, appears stated age  Head:    Normocephalic, without obvious abnormality, atraumatic  Eyes:    PERRL, conjunctiva/corneas clear, EOM's intact  Ears:    Normal TM's and external ear canals  Nose:   Mask on   Throat:   Mask on   Neck:   Supple, no lymphadenopathy;  thyroid:  no   enlargement/tenderness/nodules; no  JVD  Back:    Spine nontender, no curvature, ROM normal, no CVA     tenderness  Lungs:     Clear to auscultation bilaterally without wheezes, rales or     ronchi; respirations unlabored  Chest Wall:    No tenderness or deformity   Heart:    Regular rate and rhythm, S1 and S2 normal, no murmur, rub   or gallop  Breast  Exam:    No tenderness, masses, or nipple discharge or inversion.      No axillary lymphadenopathy  Abdomen:     Soft, non-tender, nondistended, normoactive bowel sounds,    no masses, no hepatosplenomegaly  Genitalia:    OB/GYN     Extremities:   No clubbing, cyanosis or edema  Pulses:   2+ and symmetric all extremities  Skin:   Skin color, texture, turgor normal, no rashes or lesions  Lymph nodes:   Cervical, supraclavicular, and axillary nodes normal  Neurologic:   CNII-XII intact, normal strength, sensation and gait; reflexes 2+ and symmetric  throughout          Psych:   Normal mood, affect, hygiene and grooming.        Assessment & Plan:  Routine general medical examination at a health care facility - Plan: Visual acuity screening, CBC with Differential/Platelet, Comprehensive metabolic panel, TSH, T4, free, T3 -She is new to the practice and here for a CPE.  Preventive health care reviewed.  Mammogram ordered.  Referral to gynecology.  Counseling on healthy lifestyle including diet and exercise.  Recommend regular dental and eye exams.  Immunizations reviewed and she declines COVID vaccine.  Discussed safety and health promotion.  Menorrhagia with regular cycle - Plan: TSH, T4, free, T3, Ambulatory referral to Gynecology -does not want hormone therapy. She will see gynecology for further evaluation and treatment   Obesity (BMI 30-39.9) - Plan: TSH, T4, free, T3, Lipid panel -counseling on healthy diet and increasing activity   Screening for thyroid disorder - Plan: TSH, T4, free, T3 -follow up pending results   Screening for lipid disorders - Plan: Lipid panel  Sleep concern -counseling on good sleep hygiene  Recommend no TV or cell phone in the bed.  May take melatonin up to 10 mg. Discussed the possibility of sleep med in the future including trazodone. Recommend stopping marijuana for sleep. Follow-up if sleep is not improving.  Stress -recommend relaxation/ distraction techniques. Recommend counseling   Need for hepatitis C screening test - Plan: Hepatitis C antibody -done per guidelines   Screening for HIV without presence of risk factors - Plan: HIV Antibody (routine testing w rflx) -done per guidelines   Encounter for screening mammogram for malignant neoplasm of breast - Plan: MM DIGITAL SCREENING BILATERAL -she will call to schedule

## 2020-11-11 LAB — HEPATITIS C ANTIBODY: Hep C Virus Ab: <0.1 s/co ratio (ref 0.0–0.9)

## 2020-11-11 LAB — CBC WITH DIFFERENTIAL/PLATELET
Basophils Absolute: 0 10*3/uL (ref 0.0–0.2)
Basos: 1 %
EOS (ABSOLUTE): 0.2 10*3/uL (ref 0.0–0.4)
Eos: 3 %
Hematocrit: 41.1 % (ref 34.0–46.6)
Immature Grans (Abs): 0 10*3/uL (ref 0.0–0.1)
MCH: 28.7 pg (ref 26.6–33.0)
MCHC: 32.4 g/dL (ref 31.5–35.7)
Monocytes: 7 %
Neutrophils Absolute: 2.6 10*3/uL (ref 1.4–7.0)
Neutrophils: 51 %
Platelets: 326 10*3/uL (ref 150–450)
RBC: 4.63 x10E6/uL (ref 3.77–5.28)
RDW: 13.6 % (ref 11.7–15.4)
WBC: 5.1 10*3/uL (ref 3.4–10.8)

## 2020-11-11 LAB — COMPREHENSIVE METABOLIC PANEL
ALT: 12 IU/L (ref 0–32)
Albumin/Globulin Ratio: 1.4 (ref 1.2–2.2)
BUN/Creatinine Ratio: 12 (ref 9–23)
BUN: 12 mg/dL (ref 6–24)
CO2: 22 mmol/L (ref 20–29)
Calcium: 9.8 mg/dL (ref 8.7–10.2)
Creatinine, Ser: 1.03 mg/dL — ABNORMAL HIGH (ref 0.57–1.00)
Globulin, Total: 3 g/dL (ref 1.5–4.5)
Glucose: 89 mg/dL (ref 65–99)
Sodium: 141 mmol/L (ref 134–144)
Total Protein: 7.3 g/dL (ref 6.0–8.5)
eGFR: 69 mL/min/{1.73_m2} (ref >59)

## 2020-11-11 LAB — TSH: TSH: 1.88 u[IU]/mL (ref 0.450–4.500)

## 2020-11-11 LAB — LIPID PANEL
Chol/HDL Ratio: 4.1 ratio (ref 0.0–4.4)
Cholesterol, Total: 191 mg/dL (ref 100–199)
LDL Chol Calc (NIH): 125 mg/dL — ABNORMAL HIGH (ref 0–99)
VLDL Cholesterol Cal: 19 mg/dL (ref 5–40)

## 2020-11-11 LAB — T3: T3, Total: 108 ng/dL (ref 71–180)

## 2020-11-11 LAB — T4, FREE: Free T4: 1.18 ng/dL (ref 0.82–1.77)

## 2020-11-11 NOTE — Progress Notes (Signed)
Please tell her: your LDL, the bad cholesterol, is a little high.  I do recommend that you cut back on foods high in fat, fried foods, creamy sauces and dressings.  Increase your physical activity also to help lower your cholesterol.  Make sure you are staying well-hydrated.  Otherwise your labs are fine and you are negative for HIV and hepatitis C

## 2021-03-16 ENCOUNTER — Ambulatory Visit: Payer: BC Managed Care – PPO | Admitting: Family Medicine

## 2021-03-16 ENCOUNTER — Ambulatory Visit
Admission: RE | Admit: 2021-03-16 | Discharge: 2021-03-16 | Disposition: A | Discharge status: Home / Self Care | Payer: Self-pay | Source: Ambulatory Visit | Attending: Family Medicine | Admitting: Family Medicine

## 2021-03-16 ENCOUNTER — Other Ambulatory Visit: Payer: Self-pay

## 2021-03-16 VITALS — BP 130/82 | HR 99 | Temp 98.4°F | Wt 232.4 lb

## 2021-03-16 DIAGNOSIS — M25561 Pain in right knee: Secondary | ICD-10-CM

## 2021-03-16 NOTE — Progress Notes (Signed)
   Subjective:    Patient ID: Jasmine Gentry, female    DOB: May 23, 1976, 45 y.o.   MRN: [id number]  HPI She complains of a 3-week history of right knee pain that radiates into the posterior thigh and into the calf.  No history of injury, popping, locking or grinding.  She does have a history of having left knee surgery and has had similar symptoms when that occurred.   Review of Systems     Objective:   Physical Exam Right knee exam shows no effusion.  Slight discomfort to the medial joint line.  McMurray's testing medially was quite painful but no click.  Negative McMurray's on the lateral aspect.  Anterior drawer was uncomfortable but negative.  Medial and lateral collateral ligaments intact.       Assessment & Plan:  Acute pain of right knee - Plan: DG Knee Complete 4 Views Right, Ambulatory referral to Physical Therapy I explained that this could easily be a meniscal tear but we need to get x-rays, continue on Tylenol and use 2 Aleve twice per day to help with the pain.  I will also refer her to physical therapy.  Explained the fact that this seems to be a degenerative type of an issue in we would always try to take care of it with physical therapy first.  If no improvement will then refer to orthopedics.  She was comfortable with that.

## 2021-03-16 NOTE — Patient Instructions (Signed)
You can take 2 Tylenol 4 times per day and also if needed 2 Aleve twice per day.  Do this for the next couple of weeks to see if we can quiet down.  If things quiet down I will send it to physical therapy for rehab if they do not quiet down then also do the the orthopedic surgeon

## 2021-03-21 ENCOUNTER — Telehealth: Payer: Self-pay | Admitting: Family Medicine

## 2021-03-21 NOTE — Telephone Encounter (Signed)
Pt called and Jasmine Gentry was unavailable and pt states she is at work and can't accept incoming calls. Pt's xray results and comments from JCL were relayed to pt.

## 2021-09-21 ENCOUNTER — Telehealth (INDEPENDENT_AMBULATORY_CARE_PROVIDER_SITE_OTHER): Payer: BC Managed Care – PPO | Admitting: Family Medicine

## 2021-09-21 ENCOUNTER — Other Ambulatory Visit: Payer: Self-pay | Admitting: *Deleted

## 2021-09-21 ENCOUNTER — Other Ambulatory Visit: Payer: Self-pay

## 2021-09-21 ENCOUNTER — Encounter: Payer: Self-pay | Admitting: *Deleted

## 2021-09-21 ENCOUNTER — Other Ambulatory Visit (INDEPENDENT_AMBULATORY_CARE_PROVIDER_SITE_OTHER): Payer: BC Managed Care – PPO

## 2021-09-21 ENCOUNTER — Encounter: Payer: Self-pay | Admitting: Family Medicine

## 2021-09-21 VITALS — Ht 67.5 in | Wt 225.0 lb

## 2021-09-21 DIAGNOSIS — R52 Pain, unspecified: Secondary | ICD-10-CM

## 2021-09-21 DIAGNOSIS — R059 Cough, unspecified: Secondary | ICD-10-CM

## 2021-09-21 DIAGNOSIS — R0989 Other specified symptoms and signs involving the circulatory and respiratory systems: Secondary | ICD-10-CM

## 2021-09-21 DIAGNOSIS — U071 COVID-19: Secondary | ICD-10-CM

## 2021-09-21 DIAGNOSIS — R051 Acute cough: Secondary | ICD-10-CM

## 2021-09-21 LAB — POCT INFLUENZA A/B
Influenza A, POC: NEGATIVE
Influenza B, POC: NEGATIVE

## 2021-09-21 LAB — POC COVID19 BINAXNOW: SARS Coronavirus 2 Ag: POSITIVE — AB

## 2021-09-21 MED ORDER — NIRMATRELVIR/RITONAVIR (PAXLOVID)TABLET: 3.0000 | ORAL_TABLET | Freq: Two times a day (BID) | ORAL | 0 refills | Status: AC

## 2021-09-21 MED ORDER — BENZONATATE 200 MG PO CAPS: 200.0000 mg | ORAL_CAPSULE | Freq: Three times a day (TID) | ORAL | 0 refills | Status: DC | PRN

## 2021-09-21 NOTE — Progress Notes (Signed)
Start time: 11:34 End time: 11:50  Virtual Visit via Video Note  I connected with Jasmine Gentry on 09/21/21 by a video enabled telemedicine application and verified that I am speaking with the correct person using two identifiers.  Location: Patient: home Provider: office   I discussed the limitations of evaluation and management by telemedicine and the availability of in person appointments. The patient expressed understanding and agreed to proceed.  History of Present Illness:  Chief Complaint  Patient presents with   Cough    VIRTUAL bad cough and congestion. Cannot sleep at night from coughing so bad. Chills and body aches. Has been taking so much ibu and Alka-Seltzer she does know if she has had a fever. Today is day #3. Took home covid test yesterday-negative.    Late on Monday night (actually, after midnight, so was early on 09/19/21), she started with cough, sneezing.  Developed body aches on 2/1, cough. Nasal drainage is clear.  Cough is often dry/hacky, and sometimes she gets up green phlegm, thick. Cough has been keeping her awake at night. Still having body aches.  Cannot find thermometer.  Feels hot/cold, but doesn't think she has a fever Alka selzer cough and cold, ibuprofen.  No SOB, chest pain, just some tightness/soreness from coughing. No n/v/d.  Daughter just got over the flu last week. +sick contacts at work. Negative COVID test yesterday. Works at a [employer] system, not wearing mask.  No flu shot this year. No COVID vaccines.   PMH, PSH, SH reviewed. Nonsmoker.    Outpatient Encounter Medications as of 09/21/2021  Medication Sig Note   ibuprofen (ADVIL) 200 MG tablet Take 600 mg by mouth every 6 (six) hours as needed. 09/21/2021: Last dose 8am   Phenyleph-CPM-DM-Aspirin (ALKA-SELTZER PLUS SEV COLD/CGH) 7.03-21-09-325 MG TBEF Take 2 tablets by mouth every 4 (four) hours. 09/21/2021: Last dose 8 am   acetaminophen (TYLENOL) 500 MG tablet Take by mouth. (Patient  not taking: Reported on 09/21/2021) 09/21/2021: As needed   [DISCONTINUED] ibuprofen (ADVIL) 800 MG tablet Take by mouth. (Patient not taking: Reported on 03/16/2021)    No facility-administered encounter medications on file as of 09/21/2021.   No Known Allergies  ROS: no f/c, n/v/d. +URI symptoms per HPI.  No chest pain, shortness of breath.  See HPI.   Observations/Objective:  Ht 5' 7.5" (1.715 m)    Wt 225 lb (102.1 kg)    LMP 09/04/2021 (Approximate)    BMI 34.72 kg/m   Mildly ill-appearing/tired female, in no distress. No significant coughing during the visit. She is alert and oriented. Exam is limited due to the virtual nature of the visit.  Rapid COVID + Influenze A&B negative   Assessment and Plan:  COVID-19 virus infection - unvaccinated.  Treat with Paxlovid.  Isolate for full 10d - Plan: nirmatrelvir/ritonavir EUA (PAXLOVID) 20 x 150 MG & 10 x 100MG  TABS  Acute cough - Plan: benzonatate (TESSALON) 200 MG capsule  We discussed adding plain mucinex (guaifenesin only), and can continue her OTC cold med (with decongestant, DM, ASA, antihistamine).  Also sent in tessalon for cough.    Follow Up Instructions:    I discussed the assessment and treatment plan with the patient. The patient was provided an opportunity to ask questions and all were answered. The patient agreed with the plan and demonstrated an understanding of the instructions.   The patient was advised to call back or seek an in-person evaluation if the symptoms worsen or if the condition fails  to improve as anticipated.  I spent 20 minutes dedicated to the care of this patient, including pre-visit review of records, face to face time, post-visit ordering of testing and documentation.    Jasmine Jumbo, MD

## 2021-11-01 ENCOUNTER — Ambulatory Visit: Payer: BC Managed Care – PPO | Admitting: Family Medicine

## 2021-11-01 ENCOUNTER — Ambulatory Visit
Admission: RE | Admit: 2021-11-01 | Discharge: 2021-11-01 | Disposition: A | Discharge status: Home / Self Care | Payer: BC Managed Care – PPO | Source: Ambulatory Visit | Attending: Family Medicine | Admitting: Family Medicine

## 2021-11-01 ENCOUNTER — Other Ambulatory Visit: Payer: Self-pay

## 2021-11-01 ENCOUNTER — Encounter: Payer: Self-pay | Admitting: Family Medicine

## 2021-11-01 VITALS — BP 148/92 | HR 89 | Temp 97.5°F | Wt 241.6 lb

## 2021-11-01 DIAGNOSIS — M545 Low back pain, unspecified: Secondary | ICD-10-CM | POA: Diagnosis not present

## 2021-11-01 DIAGNOSIS — R292 Abnormal reflex: Secondary | ICD-10-CM | POA: Diagnosis not present

## 2021-11-01 NOTE — Progress Notes (Signed)
? ?  Subjective:  ? ? Patient ID: Jasmine Gentry, female    DOB: [birth year], 46 y.o.   MRN: [id number] ? ?HPI ?She is here for evaluation of low back pain that she states started after she had COVID on February 2.  No weakness, numbness, tingling, history of back pain, injury to her back.  She has been using heat and 800 mg twice per day of ibuprofen with no real success.  She also notes that when she flexes her neck, she will feel back pain. ? ? ?Review of Systems ? ?   ?Objective:  ? Physical Exam ?Alert and complaining of back pain.  Tender to palpation over the mid sacral area.  Normal lumbar curve and motion.  Hip motion is normal.  Straight leg raising is negative.  No DTRs noted in her knees and 1+ DTR of right ankle with none on the left.  Normal strength and sensation. ?No clonus.  Negative Babinski. ? ? ?   ?Assessment & Plan:  ?Bilateral low back pain without sciatica, unspecified chronicity - Plan: DG Lumbar Spine Complete, CBC with Differential/Platelet, Comprehensive metabolic panel, TSH, Ambulatory referral to Neurology, CANCELED: Ambulatory referral to Neurology ? ?Hyporeflexia - Plan: CBC with Differential/Platelet, Comprehensive metabolic panel, TSH, Ambulatory referral to Neurology, CANCELED: Ambulatory referral to Neurology ?I explained that we will need to do further evaluation including blood work and x-rays and I will also refer to neurology especially with the decreased ankle reflexes. ? ?

## 2021-11-01 NOTE — Patient Instructions (Signed)
Take 800 mg of ibuprofen 3 times per day. 

## 2021-11-02 ENCOUNTER — Telehealth: Payer: Self-pay

## 2021-11-02 LAB — COMPREHENSIVE METABOLIC PANEL
ALT: 11 IU/L (ref 0–32)
AST: 14 IU/L (ref 0–40)
Albumin/Globulin Ratio: 1.6 (ref 1.2–2.2)
Albumin: 4.1 g/dL (ref 3.8–4.8)
Alkaline Phosphatase: 69 IU/L (ref 44–121)
BUN/Creatinine Ratio: 12 (ref 9–23)
BUN: 11 mg/dL (ref 6–24)
Bilirubin Total: 0.3 mg/dL (ref 0.0–1.2)
CO2: 25 mmol/L (ref 20–29)
Calcium: 9.2 mg/dL (ref 8.7–10.2)
Chloride: 104 mmol/L (ref 96–106)
Creatinine, Ser: 0.95 mg/dL (ref 0.57–1.00)
Globulin, Total: 2.5 g/dL (ref 1.5–4.5)
Glucose: 91 mg/dL (ref 70–99)
Potassium: 4.7 mmol/L (ref 3.5–5.2)
Sodium: 141 mmol/L (ref 134–144)
Total Protein: 6.6 g/dL (ref 6.0–8.5)
eGFR: 76 mL/min/{1.73_m2} (ref >59)

## 2021-11-02 LAB — CBC WITH DIFFERENTIAL/PLATELET
Basophils Absolute: 0.1 10*3/uL (ref 0.0–0.2)
Basos: 1 %
EOS (ABSOLUTE): 0.2 10*3/uL (ref 0.0–0.4)
Eos: 5 %
Hematocrit: 39.5 % (ref 34.0–46.6)
Hemoglobin: 13 g/dL (ref 11.1–15.9)
Immature Grans (Abs): 0 10*3/uL (ref 0.0–0.1)
Immature Granulocytes: 1 %
Lymphocytes Absolute: 1.8 10*3/uL (ref 0.7–3.1)
Lymphs: 36 %
MCH: 28.6 pg (ref 26.6–33.0)
MCHC: 32.9 g/dL (ref 31.5–35.7)
MCV: 87 fL (ref 79–97)
Monocytes Absolute: 0.4 10*3/uL (ref 0.1–0.9)
Monocytes: 7 %
Neutrophils Absolute: 2.6 10*3/uL (ref 1.4–7.0)
Neutrophils: 50 %
Platelets: 360 10*3/uL (ref 150–450)
RBC: 4.55 x10E6/uL (ref 3.77–5.28)
RDW: 13 % (ref 11.7–15.4)
WBC: 5 10*3/uL (ref 3.4–10.8)

## 2021-11-02 LAB — TSH: TSH: 1.76 u[IU]/mL (ref 0.450–4.500)

## 2021-11-02 MED ORDER — DICLOFENAC SODIUM 75 MG PO TBEC: 75.0000 mg | DELAYED_RELEASE_TABLET | Freq: Two times a day (BID) | ORAL | 0 refills | Status: DC

## 2021-11-02 NOTE — Telephone Encounter (Signed)
Pt states the ibuprofen is not helping, the only thing that has helped any is heat and she can't do that during the day, trying to work, lots of pain.  Would like something stronger called into [facility].

## 2021-11-02 NOTE — Telephone Encounter (Signed)
Left message for pt

## 2021-11-03 ENCOUNTER — Telehealth: Payer: Self-pay

## 2021-11-03 MED ORDER — TRAMADOL HCL 50 MG PO TABS: 50.0000 mg | ORAL_TABLET | Freq: Three times a day (TID) | ORAL | 0 refills | Status: AC | PRN

## 2021-11-03 MED ORDER — PREDNISONE 10 MG (48) PO TBPK: ORAL_TABLET | ORAL | 0 refills | Status: DC

## 2021-11-03 NOTE — Addendum Note (Signed)
Addended by: Ronnald Nian on: 11/03/2021 02:29 PM ? ? Modules accepted: Orders ? ?

## 2021-11-03 NOTE — Telephone Encounter (Signed)
Pt. Aware.

## 2021-11-03 NOTE — Telephone Encounter (Signed)
Pt. Called back stating that she was told to call back if the medicine Dr. Susann Givens called in for her was not working and she stats it is not working at all. So she wanted to know if Dr. Susann Givens could call her in something else that was stronger. Sent to multiple providers incase Dr. Susann Givens didn't see message.  ?

## 2021-11-11 ENCOUNTER — Emergency Department (HOSPITAL_COMMUNITY): Payer: BC Managed Care – PPO

## 2021-11-11 ENCOUNTER — Other Ambulatory Visit: Payer: Self-pay

## 2021-11-11 ENCOUNTER — Encounter (HOSPITAL_COMMUNITY): Payer: Self-pay

## 2021-11-11 ENCOUNTER — Emergency Department (HOSPITAL_COMMUNITY)
Admission: EM | Admit: 2021-11-11 | Discharge: 2021-11-11 | Disposition: A | Discharge status: Home / Self Care | Payer: BC Managed Care – PPO | Attending: Emergency Medicine | Admitting: Emergency Medicine

## 2021-11-11 DIAGNOSIS — N83209 Unspecified ovarian cyst, unspecified side: Secondary | ICD-10-CM

## 2021-11-11 DIAGNOSIS — N83201 Unspecified ovarian cyst, right side: Secondary | ICD-10-CM | POA: Diagnosis not present

## 2021-11-11 DIAGNOSIS — R1031 Right lower quadrant pain: Secondary | ICD-10-CM | POA: Diagnosis present

## 2021-11-11 DIAGNOSIS — N9489 Other specified conditions associated with female genital organs and menstrual cycle: Secondary | ICD-10-CM | POA: Diagnosis not present

## 2021-11-11 LAB — I-STAT CHEM 8, ED
BUN: 10 mg/dL (ref 6–20)
Calcium, Ion: 1.16 mmol/L (ref 1.15–1.40)
Chloride: 105 mmol/L (ref 98–111)
Creatinine, Ser: 1 mg/dL (ref 0.44–1.00)
Glucose, Bld: 98 mg/dL (ref 70–99)
HCT: 40 % (ref 36.0–46.0)
Hemoglobin: 13.6 g/dL (ref 12.0–15.0)
Potassium: 3.8 mmol/L (ref 3.5–5.1)
Sodium: 139 mmol/L (ref 135–145)
TCO2: 23 mmol/L (ref 22–32)

## 2021-11-11 LAB — URINALYSIS, MICROSCOPIC (REFLEX)

## 2021-11-11 LAB — URINALYSIS, ROUTINE W REFLEX MICROSCOPIC
Bilirubin Urine: NEGATIVE
Glucose, UA: NEGATIVE mg/dL
Hgb urine dipstick: NEGATIVE
Ketones, ur: NEGATIVE mg/dL
Nitrite: NEGATIVE
Protein, ur: NEGATIVE mg/dL
Specific Gravity, Urine: 1.015 (ref 1.005–1.030)
pH: 6 (ref 5.0–8.0)

## 2021-11-11 LAB — COMPREHENSIVE METABOLIC PANEL
ALT: 17 U/L (ref 0–44)
AST: 16 U/L (ref 15–41)
Albumin: 4.3 g/dL (ref 3.5–5.0)
Alkaline Phosphatase: 51 U/L (ref 38–126)
Anion gap: 9 (ref 5–15)
BUN: 13 mg/dL (ref 6–20)
CO2: 22 mmol/L (ref 22–32)
Calcium: 9.6 mg/dL (ref 8.9–10.3)
Chloride: 106 mmol/L (ref 98–111)
Creatinine, Ser: 0.91 mg/dL (ref 0.44–1.00)
GFR, Estimated: >60 mL/min (ref >60)
Glucose, Bld: 99 mg/dL (ref 70–99)
Potassium: 3.7 mmol/L (ref 3.5–5.1)
Sodium: 137 mmol/L (ref 135–145)
Total Bilirubin: 0.3 mg/dL (ref 0.3–1.2)
Total Protein: 7.6 g/dL (ref 6.5–8.1)

## 2021-11-11 LAB — CBC WITH DIFFERENTIAL/PLATELET
Abs Immature Granulocytes: 0.02 10*3/uL (ref 0.00–0.07)
Basophils Absolute: 0 10*3/uL (ref 0.0–0.1)
Basophils Relative: 1 %
Eosinophils Absolute: 0.2 10*3/uL (ref 0.0–0.5)
Eosinophils Relative: 3 %
HCT: 39.5 % (ref 36.0–46.0)
Hemoglobin: 13 g/dL (ref 12.0–15.0)
Immature Granulocytes: 0 %
Lymphocytes Relative: 43 %
Lymphs Abs: 2.5 10*3/uL (ref 0.7–4.0)
MCH: 28.8 pg (ref 26.0–34.0)
MCHC: 32.9 g/dL (ref 30.0–36.0)
MCV: 87.4 fL (ref 80.0–100.0)
Monocytes Absolute: 0.4 10*3/uL (ref 0.1–1.0)
Monocytes Relative: 8 %
Neutro Abs: 2.7 10*3/uL (ref 1.7–7.7)
Neutrophils Relative %: 45 %
Platelets: 339 10*3/uL (ref 150–400)
RBC: 4.52 MIL/uL (ref 3.87–5.11)
RDW: 13.3 % (ref 11.5–15.5)
WBC: 5.9 10*3/uL (ref 4.0–10.5)
nRBC: 0 % (ref 0.0–0.2)

## 2021-11-11 LAB — I-STAT BETA HCG BLOOD, ED (MC, WL, AP ONLY): I-stat hCG, quantitative: <5 m[IU]/mL (ref <5)

## 2021-11-11 LAB — LIPASE, BLOOD: Lipase: 32 U/L (ref 11–51)

## 2021-11-11 MED ORDER — KETOROLAC TROMETHAMINE 30 MG/ML IJ SOLN
30.0000 mg | Freq: Once | INTRAMUSCULAR | Status: AC
  Administered 2021-11-11: 30 mg via INTRAVENOUS
  Filled 2021-11-11: qty 1

## 2021-11-11 MED ORDER — IOHEXOL 300 MG/ML  SOLN
100.0000 mL | Freq: Once | INTRAMUSCULAR | Status: AC | PRN
  Administered 2021-11-11: 100 mL via INTRAVENOUS

## 2021-11-11 MED ORDER — IBUPROFEN 600 MG PO TABS: 600.0000 mg | ORAL_TABLET | Freq: Four times a day (QID) | ORAL | 0 refills | Status: DC | PRN

## 2021-11-11 MED ORDER — ONDANSETRON HCL 4 MG/2ML IJ SOLN
4.0000 mg | Freq: Once | INTRAMUSCULAR | Status: AC
  Administered 2021-11-11: 4 mg via INTRAVENOUS
  Filled 2021-11-11: qty 2

## 2021-11-11 MED ORDER — HYDROMORPHONE HCL 1 MG/ML IJ SOLN
0.5000 mg | Freq: Once | INTRAMUSCULAR | Status: AC
  Administered 2021-11-11: 0.5 mg via INTRAVENOUS
  Filled 2021-11-11: qty 1

## 2021-11-11 NOTE — Discharge Instructions (Addendum)
Please follow-up with your OB/GYN in the next 1 to 2 weeks to ensure resolution of the ovarian cyst.  Please take ibuprofen and use warm compresses for pain control. ?

## 2021-11-11 NOTE — ED Provider Notes (Signed)
?[facility]-EMERGENCY DEPT ?[facility] Emergency Department ?Provider Note ?MRN:  [id number]  ?Arrival date & time: 11/11/21    ? ?Chief Complaint   ?Abdominal Pain ?  ?History of Present Illness   ?Jasmine Gentry is a 46 y.o. year-old female presents to the ED with chief complaint of abdominal pain.  She reports sudden onset right lower abdominal pain that started this afternoon. . ? ?History provided by patient. ? ? ?Review of Systems  ?Pertinent review of systems noted in HPI.  ? ? ?Physical Exam  ? ?Vitals:  ? 11/11/21 2215 11/11/21 2245  ?BP: 132/82 135/69  ?Pulse: 70 66  ?Resp: 18 18  ?Temp:    ?SpO2: 98% 97%  ?  ?CONSTITUTIONAL:  well-appearing, NAD ?NEURO:  Alert and oriented x 3, CN 3-12 grossly intact ?EYES:  eyes equal and reactive ?ENT/NECK:  Supple, no stridor  ?CARDIO:  normal rate, regular rhythm, appears well-perfused  ?PULM:  No respiratory distress, CTAB ?GI/GU:  non-distended, moderate RLQ tenderness ?MSK/SPINE:  No gross deformities, no edema, moves all extremities  ?SKIN:  no rash, atraumatic ? ? ?*Additional and/or pertinent findings included in MDM below ? ?Diagnostic and Interventional Summary  ? ? ?Labs Reviewed  ?URINALYSIS, ROUTINE W REFLEX MICROSCOPIC - Abnormal; Notable for the following components:  ?    Result Value  ? APPearance HAZY (*)   ? Leukocytes,Ua SMALL (*)   ? All other components within normal limits  ?URINALYSIS, MICROSCOPIC (REFLEX) - Abnormal; Notable for the following components:  ? Bacteria, UA MANY (*)   ? All other components within normal limits  ?CBC WITH DIFFERENTIAL/PLATELET  ?COMPREHENSIVE METABOLIC PANEL  ?LIPASE, BLOOD  ?I-STAT CHEM 8, ED  ?I-STAT BETA HCG BLOOD, ED ([facility], [facility], [facility] ONLY)  ?  ?CT ABDOMEN PELVIS W CONTRAST  ?Final Result  ?  ?  ?Medications  ?ketorolac (TORADOL) 30 MG/ML injection 30 mg (has no administration in time range)  ?HYDROmorphone (DILAUDID) injection 0.5 mg (0.5 mg Intravenous Given 11/11/21 2224)  ?ondansetron [facility]) injection 4 mg (4 mg  Intravenous Given 11/11/21 2223)  ?iohexol (OMNIPAQUE) 300 MG/ML solution 100 mL (100 mLs Intravenous Contrast Given 11/11/21 2256)  ?  ? ?Procedures  /  Critical Care ?Procedures ? ?ED Course and Medical Decision Making  ?I have reviewed the triage vital signs, the nursing notes, and pertinent available records from the EMR. ? ?Complexity of Problems Addressed: ?High Complexity: Acute illness/injury posing a threat to life or bodily function, requiring emergent diagnostic workup, evaluation, and treatment as below. ?Comorbidities affecting this illness/injury include: ?Obesity ?Social Determinants Affecting Care: ?No clinically significant social determinants affecting this chief complaint.. ? ? ?ED Course: ?After considering the following differential, KS, appy, torsion, I ordered dilaudid for pain and agree with labs and imaging ordered in triage. ?I personally interpreted the labs which are notable for no leukocytosis or electrolyte derrangement. ?I visualized the CT, which is notable for no KS and agree with radiologist interpretation. Radiology notes ovarian cyst, likely ruptured. ? ?  ? ?Consultants: ?No consultations were needed in caring for this patient. ? ?Treatment and Plan: ?We will treat pain with NSAIDs.  Recommend warm compresses.  Recommend GYN follow-up. ? ?I considered admission due to patient's initial presentation, but after considering the examination and diagnostic results, patient will not require admission and can be discharged with outpatient follow-up. ? ? ? ?Final Clinical Impressions(s) / ED Diagnoses  ? ?  ICD-10-CM   ?1. Ruptured ovarian cyst  N83.209   ?  ?  ?  ED Discharge Orders   ? ?      Ordered  ?  ibuprofen (ADVIL) 600 MG tablet  Every 6 hours PRN       ? 11/11/21 2336  ? ?  ?  ? ?  ?  ? ? ?Discharge Instructions Discussed with and Provided to Patient:  ? ? ? ?Discharge Instructions   ? ?  ?Please follow-up with your OB/GYN in the next 1 to 2 weeks to ensure resolution of the  ovarian cyst.  Please take ibuprofen and use warm compresses for pain control. ? ? ? ? ?  ?Roxy Horseman, PA-C ?11/11/21 2341 ? ?  ?Gilda Crease, MD ?11/12/21 0422 ? ?

## 2021-11-11 NOTE — ED Provider Triage Note (Signed)
Emergency Medicine Provider Triage Evaluation Note ? ?Jasmine Gentry , a 46 y.o. female  was evaluated in triage.  Pt complains of abdominal pain of 45-minute duration.  She reports sudden onset starting around navel and radiating down to her lower abdomen.  She denies any recent illness.  Reports nausea since arriving to the emergency room.  Has not vomited.  Denies chills.  ? ?Review of Systems  ?Positive: As above ?Negative: As above ? ?Physical Exam  ?BP (!) 161/119 (BP Location: Right Arm)   Pulse 83   Temp 98.2 ?F (36.8 ?C) (Oral)   Resp 19   Ht 5' 7.5" (1.715 m)   Wt 109.3 kg   LMP 10/27/2021 (Exact Date)   SpO2 100%   BMI 37.19 kg/m?  ?Gen:   Awake, no distress   ?Resp:  Normal effort  ?MSK:   Moves extremities without difficulty ?Other:  Diffuse abdominal tenderness primarily on the right side of the abdomen. ? ?Medical Decision Making  ?Medically screening exam initiated at 8:47 PM.  Appropriate orders placed.  Jon ANJELA CASSARA was informed that the remainder of the evaluation will be completed by another provider, this initial triage assessment does not replace that evaluation, and the importance of remaining in the ED until their evaluation is complete. ? ? ?  ?Marita Kansas, PA-C ?11/11/21 2048 ? ?

## 2021-11-11 NOTE — ED Triage Notes (Signed)
Patient began having abdominal pain within the last 30 minutes. Pain is around her belly button and moves to her rectum when she sits.  ?

## 2021-11-14 ENCOUNTER — Encounter: Payer: BC Managed Care – PPO | Admitting: Family Medicine

## 2021-11-29 NOTE — Progress Notes (Addendum)
? ?Complete physical exam ? ? ?Patient: Jasmine Gentry   DOB: 24-Oct-1975   46 y.o. Female  MRN: [id number] ?Visit Date: 11/30/2021 ? ?Chief Complaint  ?Patient presents with  ? Annual Exam  ?  Fasting CPE with Pap. no other concerns  ? ?Subjective  ?  ?Jasmine Gentry is a 46 y.o. female who presents today for a complete physical exam.  ? ?Reports is generally feeling well; is eating an intermittent fasting diet; lost 5 pounds; is not sleeping well about 5 hours of sleep a night; can fall asleep, but has trouble staying asleep; OTC melatonin helps a little, reports she took a prescription sleeping medicine years ago but doesn't remember the name; drinks 50 - 60 ounces of water a day; is planning to start walking on the treadmill and doing cardio 2 times a week; also reports that she has a rash in her genital area that is itchy; feels it's due to getting hot and sweaty. ? ?HPI ?HPI   ? ? Annual Exam   ? Additional comments: Fasting CPE with Pap. no other concerns ? ?  ?  ?Last edited by Sammuel Cooper, CMA on 11/30/2021  2:51 PM.  ?  ?  ? ? ?Past Medical History:  ?Diagnosis Date  ? 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 Chlamydia   ? General counseling for prescription of oral contraceptives 01/26/2013  ? Headache(784.0)   ? Menorrhagia 01/05/2013  ? ?Past Surgical History:  ?Procedure Laterality Date  ? carpel tunnel release    ? NASAL SINUS SURGERY N/A 01/30/2014  ? Procedure: IRRIGATION AND DEBRIDEMENT NASAL SEPTAL ABSCESS;  Surgeon: Flo Shanks, MD;  Location: [facility] OR;  Service: ENT;  Laterality: N/A;  ? TONSILLECTOMY    ? TUBAL LIGATION    ? ?Social History  ? ?Socioeconomic History  ? Marital status: Married  ?  Spouse name: Not on file  ? Number of children: Not on file  ? Years of education: Not on file  ? Highest education level: Not on file  ?Occupational History  ? Not on file  ?Tobacco Use  ? Smoking status: Never  ? Smokeless tobacco: Never  ?Substance and Sexual Activity  ? Alcohol use: Yes  ?  Comment: occ.   ? Drug use: No  ? Sexual activity: Yes  ?  Birth control/protection: Surgical  ?  Comment: last intercourse 3-4 wks ago  ?Other Topics Concern  ? Not on file  ?Social History Narrative  ? Not on file  ? ?Social Determinants of Health  ? ?Financial Resource Strain: Not on file  ?Food Insecurity: Not on file  ?Transportation Needs: Not on file  ?Physical Activity: Not on file  ?Stress: Not on file  ?Social Connections: Not on file  ?Intimate Partner Violence: Not on file  ? ?Family Status  ?Relation Name Status  ? Mother  (Not Specified)  ? Mat Aunt  (Not Specified)  ? MGM  (Not Specified)  ? ?Family History  ?Problem Relation Age of Onset  ? Asthma Mother   ? Hypertension Mother   ? Diabetes Maternal Aunt   ? Hypertension Maternal Grandmother   ? ?No Known Allergies  ?Patient Care Team: ?Lexine Baton as PCP - General (Physician Assistant)  ? ?Medications: ?Outpatient Medications Prior to Visit  ?Medication Sig  ? ibuprofen (ADVIL) 600 MG tablet Take 1 tablet (600 mg total) by mouth every 6 (six) hours as needed.  ? ibuprofen (ADVIL) 200 MG tablet Take 600 mg by  mouth every 6 (six) hours as needed. (Patient not taking: Reported on 11/30/2021)  ? [DISCONTINUED] acetaminophen (TYLENOL) 500 MG tablet Take by mouth. (Patient not taking: Reported on 09/21/2021)  ? [DISCONTINUED] benzonatate (TESSALON) 200 MG capsule Take 1 capsule (200 mg total) by mouth 3 (three) times daily as needed for cough. (Patient not taking: Reported on 11/01/2021)  ? [DISCONTINUED] diclofenac (VOLTAREN) 75 MG EC tablet Take 1 tablet (75 mg total) by mouth 2 (two) times daily. (Patient not taking: Reported on 11/30/2021)  ? [DISCONTINUED] Phenyleph-CPM-DM-Aspirin (ALKA-SELTZER PLUS SEV COLD/CGH) 7.03-21-09-325 MG TBEF Take 2 tablets by mouth every 4 (four) hours. (Patient not taking: Reported on 11/01/2021)  ? [DISCONTINUED] predniSONE (STERAPRED UNI-PAK 48 TAB) 10 MG (48) TBPK tablet Take as per manufacturer's recommendations (Patient not  taking: Reported on 11/30/2021)  ? ?No facility-administered medications prior to visit.  ? ? ?Review of Systems  ?Constitutional:  Negative for activity change and fever.  ?HENT:  Negative for congestion, ear pain and voice change.   ?Eyes:  Negative for redness.  ?Respiratory:  Negative for cough.   ?Cardiovascular:  Negative for chest pain.  ?Gastrointestinal:  Negative for constipation and diarrhea.  ?Endocrine: Negative for polyuria.  ?Genitourinary:  Negative for flank pain.  ?Musculoskeletal:  Negative for gait problem and neck stiffness.  ?Skin:  Positive for rash. Negative for color change.  ?Neurological:  Negative for dizziness.  ?Hematological:  Negative for adenopathy.  ?Psychiatric/Behavioral:  Positive for sleep disturbance. Negative for agitation, behavioral problems and confusion.   ? ?Last CBC ?Lab Results  ?Component Value Date  ? WBC 5.9 11/11/2021  ? HGB 13.6 11/11/2021  ? HCT 40.0 11/11/2021  ? MCV 87.4 11/11/2021  ? MCH 28.8 11/11/2021  ? RDW 13.3 11/11/2021  ? PLT 339 11/11/2021  ? ?Last metabolic panel ?Lab Results  ?Component Value Date  ? GLUCOSE 98 11/11/2021  ? NA 139 11/11/2021  ? K 3.8 11/11/2021  ? CL 105 11/11/2021  ? CO2 22 11/11/2021  ? BUN 10 11/11/2021  ? CREATININE 1.00 11/11/2021  ? GFRNONAA >60 11/11/2021  ? CALCIUM 9.6 11/11/2021  ? PROT 7.6 11/11/2021  ? ALBUMIN 4.3 11/11/2021  ? LABGLOB 2.5 11/01/2021  ? AGRATIO 1.6 11/01/2021  ? BILITOT 0.3 11/11/2021  ? ALKPHOS 51 11/11/2021  ? AST 16 11/11/2021  ? ALT 17 11/11/2021  ? ANIONGAP 9 11/11/2021  ? ?Last lipids ?Lab Results  ?Component Value Date  ? CHOL 191 11/10/2020  ? HDL 47 11/10/2020  ? LDLCALC 125 (H) 11/10/2020  ? TRIG 104 11/10/2020  ? CHOLHDL 4.1 11/10/2020  ? ?Last hemoglobin A1c ?No results found for: HGBA1C ?Last thyroid functions ?Lab Results  ?Component Value Date  ? TSH 1.760 11/01/2021  ? T3TOTAL 108 11/10/2020  ? ?Last vitamin D ?No results found for: 25OHVITD2, 25OHVITD3, VD25OH ?Last vitamin B12 and  Folate ?No results found for: VITAMINB12, FOLATE ?  ? ?The 10-year ASCVD risk score (Arnett DK, et al., 2019) is: 1.9% ? ? Objective  ?  ?BP 140/80   Pulse 84   Ht 5' 7.5" (1.715 m)   Wt 236 lb 3.2 oz (107.1 kg)   LMP 11/26/2021   SpO2 97%   BMI 36.45 kg/m?  ? ?  ? ? ?Physical Exam ?Vitals and nursing note reviewed.  ?Constitutional:   ?   General: She is not in acute distress. ?   Appearance: Normal appearance. She is not ill-appearing.  ?HENT:  ?   Head: Normocephalic and atraumatic.  ?  Right Ear: Tympanic membrane, ear canal and external ear normal.  ?   Left Ear: Tympanic membrane, ear canal and external ear normal.  ?   Nose: No congestion.  ?Eyes:  ?   Extraocular Movements: Extraocular movements intact.  ?   Conjunctiva/sclera: Conjunctivae normal.  ?   Pupils: Pupils are equal, round, and reactive to light.  ?Neck:  ?   Vascular: No carotid bruit.  ?Cardiovascular:  ?   Rate and Rhythm: Normal rate and regular rhythm.  ?   Pulses: Normal pulses.  ?   Heart sounds: Normal heart sounds.  ?Pulmonary:  ?   Effort: Pulmonary effort is normal.  ?   Breath sounds: Normal breath sounds. No wheezing.  ?Abdominal:  ?   General: Bowel sounds are normal.  ?   Palpations: Abdomen is soft.  ?Genitourinary: ?   General: Normal vulva.  ?   Vagina: No vaginal discharge.  ?   Comments: Pelvic Exam: ?External Genitalia:  No lesions, BUS negative for discharge ?Vagina:  Pink, good support, normal discharge ?Cervix: No lesions present, negative CMT ?Uterus:  NSSC mobile and non tender ?Adnexa:  No masses or tenderness bilaterally  ?Musculoskeletal:     ?   General: Normal range of motion.  ?   Cervical back: Normal range of motion and neck supple.  ?   Right lower leg: No edema.  ?   Left lower leg: No edema.  ?Skin: ?   General: Skin is warm and dry.  ?   Findings: No bruising.  ?Neurological:  ?   General: No focal deficit present.  ?   Mental Status: She is alert and oriented to person, place, and time.  ?Psychiatric:      ?   Mood and Affect: Mood normal.     ?   Behavior: Behavior normal.     ?   Thought Content: Thought content normal.  ?  ? ? ?Last depression screening scores ? ?  11/30/2021  ?  2:53 PM 11/10/2020  ?  2:53 PM  ?P

## 2021-11-30 ENCOUNTER — Other Ambulatory Visit (HOSPITAL_COMMUNITY)
Admission: RE | Admit: 2021-11-30 | Discharge: 2021-11-30 | Disposition: A | Discharge status: Home / Self Care | Payer: BC Managed Care – PPO | Source: Ambulatory Visit | Attending: Physician Assistant | Admitting: Physician Assistant

## 2021-11-30 ENCOUNTER — Encounter: Payer: Self-pay | Admitting: Physician Assistant

## 2021-11-30 ENCOUNTER — Ambulatory Visit (INDEPENDENT_AMBULATORY_CARE_PROVIDER_SITE_OTHER): Payer: BC Managed Care – PPO | Admitting: Physician Assistant

## 2021-11-30 VITALS — BP 140/80 | HR 84 | Ht 67.5 in | Wt 236.2 lb

## 2021-11-30 DIAGNOSIS — Z0000 Encounter for general adult medical examination without abnormal findings: Secondary | ICD-10-CM

## 2021-11-30 DIAGNOSIS — E785 Hyperlipidemia, unspecified: Secondary | ICD-10-CM | POA: Diagnosis not present

## 2021-11-30 DIAGNOSIS — Z124 Encounter for screening for malignant neoplasm of cervix: Secondary | ICD-10-CM | POA: Diagnosis not present

## 2021-11-30 DIAGNOSIS — Z01419 Encounter for gynecological examination (general) (routine) without abnormal findings: Secondary | ICD-10-CM | POA: Diagnosis present

## 2021-11-30 DIAGNOSIS — B372 Candidiasis of skin and nail: Secondary | ICD-10-CM

## 2021-11-30 DIAGNOSIS — Z6836 Body mass index (BMI) 36.0-36.9, adult: Secondary | ICD-10-CM

## 2021-11-30 DIAGNOSIS — F5101 Primary insomnia: Secondary | ICD-10-CM

## 2021-11-30 DIAGNOSIS — Z1231 Encounter for screening mammogram for malignant neoplasm of breast: Secondary | ICD-10-CM

## 2021-11-30 DIAGNOSIS — E6609 Other obesity due to excess calories: Secondary | ICD-10-CM | POA: Insufficient documentation

## 2021-11-30 DIAGNOSIS — G4700 Insomnia, unspecified: Secondary | ICD-10-CM | POA: Insufficient documentation

## 2021-11-30 MED ORDER — CLOTRIMAZOLE-BETAMETHASONE 1-0.05 % EX CREA: 1.0000 "application " | TOPICAL_CREAM | Freq: Two times a day (BID) | CUTANEOUS | 1 refills | Status: AC

## 2021-11-30 MED ORDER — ZOLPIDEM TARTRATE 10 MG PO TABS: 10.0000 mg | ORAL_TABLET | Freq: Every evening | ORAL | 2 refills | Status: DC | PRN

## 2021-11-30 NOTE — Patient Instructions (Addendum)
For high cholesterol - Increase fiber intake (Benefiber or Metamucil, Cherrios,  oatmeal, beans, nuts, fruits and vegetables), limit saturated fats (in fried foods, red meat), can add OTC fish oil supplement, eat fish with Omega-3 fatty acids like salmon and tuna, exercise for 30 minutes 3 - 5 times a week, drink 8 - 10 glasses of water a day ? ? ? ?Preventative Care for Adults - Female ?   ?  MAINTAIN REGULAR HEALTH EXAMS: ?A routine yearly physical is a good way to check in with your primary care provider about your health and preventive screening. It is also an opportunity to share updates about your health and any concerns you have, and receive a thorough all-over exam.  ?Most health insurance companies pay for at least some preventative services.  Check with your health plan for specific coverages. ? ?WHAT PREVENTATIVE SERVICES DO WOMEN NEED? ?Adult women should have their weight and blood pressure checked regularly.  ?Women age 56 and older should have their cholesterol levels checked regularly. ?Women should be screened for cervical cancer with a Pap smear and pelvic exam beginning at either age 28, or 3 years after they become sexually activity.   ?Breast cancer screening generally begins at age 36 with a mammogram and breast exam by your primary care provider.   ?Beginning at age 28 and continuing to age 38, women should be screened for colorectal cancer.  Certain people may need continued testing until age 49. ?Updating vaccinations is part of preventative care.  Vaccinations help protect against diseases such as the flu. ?Osteoporosis is a disease in which the bones lose minerals and strength as we age. Women ages 3 and over should discuss this with their caregivers, as should women after menopause who have other risk factors. ?Lab tests are generally done as part of preventative care to screen for anemia and blood disorders, to screen for problems with the kidneys and liver, to screen for bladder  problems, to check blood sugar, and to check your cholesterol level. ?Preventative services generally include counseling about diet, exercise, avoiding tobacco, drugs, excessive alcohol consumption, and sexually transmitted infections.   ? ?GENERAL RECOMMENDATIONS FOR GOOD HEALTH: ? ?Healthy diet: ?Eat a variety of foods, including fruit, vegetables, animal or vegetable protein, such as meat, fish, chicken, and eggs, or beans, lentils, tofu, and grains, such as rice. ?Drink plenty of water daily (60 - 80 ounces or 8 - 10 glasses a day) ?Decrease saturated fat in the diet, avoid lots of red meat, processed foods, sweets, fast foods, and fried foods. For high cholesterol - Increase fiber intake (Benefiber or Metamucil, Cherrios,  oatmeal, beans, nuts, fruits and vegetables), limit saturated fats (in fried foods, red meat), can add OTC fish oil supplement, eat fish with Omega-3 fatty acids like salmon and tuna, exercise for 30 minutes 3 - 5 times a week, drink 8 - 10 glasses of water a day ? ?Exercise: ?Aerobic exercise helps maintain good heart health. At least 30-40 minutes of moderate-intensity exercise is recommended. For example, a brisk walk that increases your heart rate and breathing. This should be done on most days of the week.  ?Find a type of exercise or a variety of exercises that you enjoy so that it becomes a part of your daily life.  Examples are running, walking, swimming, water aerobics, and biking.  For motivation and support, explore group exercise such as aerobic class, spin class, Zumba, Yoga,or  martial arts, etc.   ?Set exercise goals for yourself,  such as a certain weight goal, walk or run in a race such as a 5k walk/run.  Speak to your primary care provider about exercise goals. ? ?Disease prevention: ?If you smoke or chew tobacco, find out from your caregiver how to quit. It can literally save your life, no matter how long you have been a tobacco user. If you do not use tobacco, never begin.   ?Maintain a healthy diet and normal weight. Increased weight leads to problems with blood pressure and diabetes.  ?The Body Mass Index or BMI is a way of measuring how much of your body is fat. Having a BMI above 27 increases the risk of heart disease, diabetes, hypertension, stroke and other problems related to obesity. Your caregiver can help determine your BMI and based on it develop an exercise and dietary program to help you achieve or maintain this important measurement at a healthful level. ?High blood pressure causes heart and blood vessel problems.  Persistent high blood pressure should be treated with medicine if weight loss and exercise do not work.  ?Fat and cholesterol leaves deposits in your arteries that can block them. This causes heart disease and vessel disease elsewhere in your body.  If your cholesterol is found to be high, or if you have heart disease or certain other medical conditions, then you may need to have your cholesterol monitored frequently and be treated with medication.  ?Ask if you should have a cardiac stress test if your history suggests this. A stress test is a test done on a treadmill that looks for heart disease. This test can find disease prior to there being a problem. ?Menopause can be associated with physical symptoms and risks. Hormone replacement therapy is available to decrease these. You should talk to your caregiver about whether starting or continuing to take hormones is right for you.  ?Osteoporosis is a disease in which the bones lose minerals and strength as we age. This can result in serious bone fractures. Risk of osteoporosis can be identified using a bone density scan. Women ages 74 and over should discuss this with their caregivers, as should women after menopause who have other risk factors. Ask your caregiver whether you should be taking a calcium supplement and Vitamin D, to reduce the rate of osteoporosis.  ?Avoid drinking alcohol in excess (more than two  drinks per day).  Avoid use of street drugs. Do not share needles with anyone. Ask for professional help if you need assistance or instructions on stopping the use of alcohol, cigarettes, and/or drugs. ?Brush your teeth twice a day with fluoride toothpaste, and floss once a day. Good oral hygiene prevents tooth decay and gum disease. The problems can be painful, unattractive, and can cause other health problems. Visit your dentist for a routine oral and dental check up and preventive care every 6-12 months.  ?Look at your skin regularly.  Use a mirror to look at your back. Notify your caregivers of changes in moles, especially if there are changes in shapes, colors, a size larger than a pencil eraser, an irregular border, or development of new moles. ? ?Safety: ?Use seatbelts 100% of the time, whether driving or as a passenger.  Use safety devices such as hearing protection if you work in environments with loud noise or significant background noise.  Use safety glasses when doing any work that could send debris in to the eyes.  Use a helmet if you ride a bike or motorcycle.  Use appropriate safety gear  for contact sports.  Talk to your caregiver about gun safety. ?Use sunscreen with a SPF (or skin protection factor) of 15 or greater.  Lighter skinned people are at a greater risk of skin cancer. Don?t forget to also wear sunglasses in order to protect your eyes from too much damaging sunlight. Damaging sunlight can accelerate cataract formation.  ?Practice safe sex. Use condoms. Condoms are used for birth control and to help reduce the spread of sexually transmitted infections (or STIs).  Some of the STIs are gonorrhea (the clap), chlamydia, syphilis, trichomonas, herpes, HPV (human papilloma virus) and HIV (human immunodeficiency virus) which causes AIDS. The herpes, HIV and HPV are viral illnesses that have no cure. These can result in disability, cancer and death.  ?Keep carbon monoxide and smoke detectors in your  home functioning at all times. Change the batteries every 6 months or use a model that plugs into the wall.  ? ?Vaccinations: ?Stay up to date with your tetanus shots and other required immunizations. You

## 2021-12-01 LAB — COMPREHENSIVE METABOLIC PANEL
ALT: 15 IU/L (ref 0–32)
AST: 17 IU/L (ref 0–40)
Albumin/Globulin Ratio: 1.7 (ref 1.2–2.2)
Albumin: 4.3 g/dL (ref 3.8–4.8)
Alkaline Phosphatase: 64 IU/L (ref 44–121)
BUN/Creatinine Ratio: 7 — ABNORMAL LOW (ref 9–23)
BUN: 8 mg/dL (ref 6–24)
Bilirubin Total: 0.3 mg/dL (ref 0.0–1.2)
CO2: 24 mmol/L (ref 20–29)
Calcium: 9.8 mg/dL (ref 8.7–10.2)
Chloride: 103 mmol/L (ref 96–106)
Creatinine, Ser: 1.12 mg/dL — ABNORMAL HIGH (ref 0.57–1.00)
Globulin, Total: 2.5 g/dL (ref 1.5–4.5)
Glucose: 86 mg/dL (ref 70–99)
Potassium: 4.4 mmol/L (ref 3.5–5.2)
Sodium: 139 mmol/L (ref 134–144)
Total Protein: 6.8 g/dL (ref 6.0–8.5)
eGFR: 62 mL/min/{1.73_m2} (ref >59)

## 2021-12-01 LAB — CBC WITH DIFFERENTIAL/PLATELET
Basophils Absolute: 0 10*3/uL (ref 0.0–0.2)
Basos: 1 %
EOS (ABSOLUTE): 0.1 10*3/uL (ref 0.0–0.4)
Eos: 4 %
Hematocrit: 40.7 % (ref 34.0–46.6)
Hemoglobin: 13.1 g/dL (ref 11.1–15.9)
Immature Grans (Abs): 0 10*3/uL (ref 0.0–0.1)
Immature Granulocytes: 0 %
Lymphocytes Absolute: 1.8 10*3/uL (ref 0.7–3.1)
Lymphs: 44 %
MCH: 28.2 pg (ref 26.6–33.0)
MCHC: 32.2 g/dL (ref 31.5–35.7)
MCV: 88 fL (ref 79–97)
Monocytes Absolute: 0.3 10*3/uL (ref 0.1–0.9)
Monocytes: 8 %
Neutrophils Absolute: 1.7 10*3/uL (ref 1.4–7.0)
Neutrophils: 43 %
Platelets: 345 10*3/uL (ref 150–450)
RBC: 4.65 x10E6/uL (ref 3.77–5.28)
RDW: 13.3 % (ref 11.7–15.4)
WBC: 4 10*3/uL (ref 3.4–10.8)

## 2021-12-01 LAB — LIPID PANEL
Chol/HDL Ratio: 4.2 ratio (ref 0.0–4.4)
Cholesterol, Total: 176 mg/dL (ref 100–199)
HDL: 42 mg/dL (ref >39)
LDL Chol Calc (NIH): 109 mg/dL — ABNORMAL HIGH (ref 0–99)
Triglycerides: 139 mg/dL (ref 0–149)
VLDL Cholesterol Cal: 25 mg/dL (ref 5–40)

## 2021-12-01 LAB — HEMOGLOBIN A1C
Est. average glucose Bld gHb Est-mCnc: 114 mg/dL
Hgb A1c MFr Bld: 5.6 % (ref 4.8–5.6)

## 2021-12-06 LAB — CYTOLOGY - PAP
Comment: NEGATIVE
Diagnosis: UNDETERMINED — AB
High risk HPV: NEGATIVE

## 2021-12-07 ENCOUNTER — Other Ambulatory Visit: Payer: Self-pay | Admitting: Physician Assistant

## 2021-12-07 DIAGNOSIS — R87611 Atypical squamous cells cannot exclude high grade squamous intraepithelial lesion on cytologic smear of cervix (ASC-H): Secondary | ICD-10-CM

## 2021-12-26 NOTE — Progress Notes (Deleted)
GYNECOLOGY  VISIT   HPI: 46 y.o.   Married  [place]  female   [phone] with No LMP recorded.   here for f/u due to ASCUS, HPV- neg on 11/30/21.   GYNECOLOGIC HISTORY: No LMP recorded. Contraception: BTL Menopausal hormone therapy: n/a Last mammogram: ? 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 (Patient not taking: Reported on 11/30/2021)     ibuprofen (ADVIL) 600 MG tablet Take 1 tablet (600 mg total) by mouth every 6 (six) hours as needed. 30 tablet 0   zolpidem (AMBIEN) 10 MG tablet Take 1 tablet (10 mg total) by mouth at bedtime as needed for sleep. 30 tablet 2   No current facility-administered medications for this visit.     ALLERGIES: Patient has no known allergies.  Family History  Problem Relation Age of Onset   Asthma Mother    Hypertension Mother     Diabetes Maternal Aunt    Hypertension Maternal Grandmother     Social History   Socioeconomic History   Marital status: Married    Spouse name: Not on file   Number of children: Not on file   Years of education: Not on file   Highest education level: Not on file  Occupational History   Not on file  Tobacco Use   Smoking status: Never   Smokeless tobacco: Never  Substance and Sexual Activity   Alcohol use: Yes    Comment: occ.   Drug use: No   Sexual activity: Yes    Birth control/protection: Surgical    Comment: last intercourse 3-4 wks ago  Other Topics Concern   Not on file  Social History Narrative   Not on file   Social Determinants of Health   Financial Resource Strain: Not on file  Food Insecurity: Not on file  Transportation Needs: Not on file  Physical Activity: Not on file  Stress: Not on file  Social Connections: Not on file  Intimate Partner Violence: Not on file    Review  of Systems  PHYSICAL EXAMINATION:    There were no vitals taken for this visit.    General appearance: alert, cooperative and appears stated age Head: Normocephalic, without obvious abnormality, atraumatic Neck: no adenopathy, supple, symmetrical, trachea midline and thyroid normal to inspection and palpation Lungs: clear to auscultation bilaterally Breasts: normal appearance, no masses or tenderness, No nipple retraction or dimpling, No nipple discharge or bleeding, No axillary or supraclavicular adenopathy Heart: regular rate and rhythm Abdomen: soft, non-tender, no masses,  no organomegaly Extremities: extremities normal, atraumatic, no cyanosis or edema Skin: Skin color, texture, turgor normal. No rashes or lesions Lymph nodes: Cervical, supraclavicular, and axillary nodes normal. No abnormal inguinal nodes palpated Neurologic: Grossly normal  Pelvic: External genitalia:  no lesions              Urethra:  normal appearing urethra with no masses, tenderness or lesions               Bartholins and Skenes: normal                 Vagina: normal appearing vagina with normal color and discharge, no lesions              Cervix: no lesions                Bimanual Exam:  Uterus:  normal size, contour, position, consistency, mobility, non-tender              Adnexa: no mass, fullness, tenderness              Rectal exam: {yes no:314532}.  Confirms.              Anus:  normal sphincter tone, no lesions  Chaperone was present for exam:  ***  ASSESSMENT     PLAN     An After Visit Summary was printed and given to the patient.  ______ minutes face to face time of which over 50% was spent in counseling.

## 2022-01-03 ENCOUNTER — Ambulatory Visit: Payer: BC Managed Care – PPO | Admitting: Obstetrics and Gynecology

## 2022-01-23 ENCOUNTER — Other Ambulatory Visit: Payer: Self-pay | Admitting: Family Medicine

## 2022-02-17 ENCOUNTER — Encounter (HOSPITAL_BASED_OUTPATIENT_CLINIC_OR_DEPARTMENT_OTHER): Payer: Self-pay | Admitting: Radiology

## 2022-02-17 ENCOUNTER — Ambulatory Visit (HOSPITAL_BASED_OUTPATIENT_CLINIC_OR_DEPARTMENT_OTHER)
Admission: RE | Admit: 2022-02-17 | Discharge: 2022-02-17 | Disposition: A | Discharge status: Home / Self Care | Payer: BC Managed Care – PPO | Source: Ambulatory Visit | Attending: Physician Assistant | Admitting: Physician Assistant

## 2022-02-17 DIAGNOSIS — R928 Other abnormal and inconclusive findings on diagnostic imaging of breast: Secondary | ICD-10-CM | POA: Diagnosis not present

## 2022-02-17 DIAGNOSIS — Z1231 Encounter for screening mammogram for malignant neoplasm of breast: Secondary | ICD-10-CM | POA: Diagnosis present

## 2022-02-21 ENCOUNTER — Ambulatory Visit: Payer: BC Managed Care – PPO | Admitting: Obstetrics and Gynecology

## 2022-02-21 ENCOUNTER — Encounter: Payer: Self-pay | Admitting: Obstetrics and Gynecology

## 2022-02-21 VITALS — BP 140/84 | HR 71 | Ht 66.5 in | Wt 232.0 lb

## 2022-02-21 DIAGNOSIS — R87610 Atypical squamous cells of undetermined significance on cytologic smear of cervix (ASC-US): Secondary | ICD-10-CM

## 2022-02-21 DIAGNOSIS — N9489 Other specified conditions associated with female genital organs and menstrual cycle: Secondary | ICD-10-CM

## 2022-02-21 DIAGNOSIS — N83201 Unspecified ovarian cyst, right side: Secondary | ICD-10-CM | POA: Diagnosis not present

## 2022-02-21 NOTE — Progress Notes (Signed)
GYNECOLOGY  VISIT   HPI: 46 y.o.   Married  [place]  female   [phone] with Patient's last menstrual period was 02/15/2022 (exact date).   here for abnormal pap 11-30-21 ASCUS:Neg HR HPV. This is first pap that has been abnormal per patient.   Her las prior pap was 2022 at Dr. Vonda Antigua office at [facility].   Had an ovarian cyst rupture 3 weeks prior to the pap.  CT scan showed complex right ovarian cyst and endometrial mass not well defined.  Report reviewed in Epic.   No change in partner.    Regular menses.  Heavy menses for 3 - 6 days per month. No bleeding outside of her cycle time.  No known history of fibroids.   GYNECOLOGIC HISTORY: Patient's last menstrual period was 02/15/2022 (exact date). Contraception:  Tubal Menopausal hormone therapy:  none Last mammogram:  02-17-22 Poss.mass Rt.Br, Poss.mass Lt.br.--Further evaluation recommended.--SEE EPIC Last pap smear:   11-30-21 ASCUS:Neg HR HPV, 01-01-14 Neg:Neg HR HPV, 01-01-13 Neg:Neg HR HPV        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   Chlamydia    General counseling for prescription of oral contraceptives 01/26/2013   Headache(784.0)    Menorrhagia 01/05/2013   STD (sexually transmitted disease)     Past Surgical History:  Procedure Laterality Date   carpel tunnel release     NASAL SINUS SURGERY N/A 01/30/2014   Procedure: IRRIGATION AND DEBRIDEMENT NASAL SEPTAL ABSCESS;  Surgeon: Flo Shanks, MD;   Location: [facility] OR;  Service: ENT;  Laterality: N/A;   TONSILLECTOMY     TUBAL LIGATION      Current Outpatient Medications  Medication Sig Dispense Refill   ibuprofen (ADVIL) 600 MG tablet Take 1 tablet (600 mg total) by mouth every 6 (six) hours as needed. 30 tablet 0   zolpidem (AMBIEN) 10 MG tablet Take 1 tablet (10 mg total) by mouth at bedtime as needed for sleep. 30 tablet 2   No current facility-administered medications for this visit.     ALLERGIES: Patient has no known allergies.  Family History  Problem Relation Age of Onset   Asthma Mother    Hypertension Mother    Diabetes Maternal Aunt    Hypertension Maternal Grandmother    Diabetes Maternal Grandfather     Social History   Socioeconomic History   Marital status: Married    Spouse name: Not on file   Number of children: Not on file   Years of education: Not on file   Highest education level: Not on file  Occupational History   Not on file  Tobacco Use   Smoking status: Never   Smokeless tobacco: Never  Vaping Use  Vaping Use: Never used  Substance and Sexual Activity   Alcohol use: Yes    Comment: 2 drinks/month   Drug use: Yes    Types: Marijuana    Comment: occ   Sexual activity: Yes    Birth control/protection: Surgical    Comment: last intercourse 3-4 wks ago, first intercourse>17  Other Topics Concern   Not on file  Social History Narrative   Not on file   Social Determinants of Health   Financial Resource Strain: Not on file  Food Insecurity: Not on file  Transportation Needs: Not on file  Physical Activity: Not on file  Stress: Not on file  Social Connections: Not on file  Intimate Partner Violence: Not on file    Review of Systems  All other systems reviewed and are negative.   PHYSICAL EXAMINATION:    BP 140/84   Pulse 71   Ht 5' 6.5" (1.689 m)   Wt 232 lb (105.2 kg)   LMP 02/15/2022 (Exact Date)   SpO2 99%   BMI 36.88 kg/m     General appearance: alert, cooperative  and appears stated age Head: Normocephalic, without obvious abnormality, atraumatic Neck: no adenopathy, supple, symmetrical, trachea midline and thyroid normal to inspection and palpation Lungs: clear to auscultation bilaterally Heart: regular rate and rhythm Abdomen: soft, non-tender, no masses,  no organomegaly Extremities: extremities normal, atraumatic, no cyanosis or edema No abnormal inguinal nodes palpated Neurologic: Grossly normal  Pelvic: External genitalia:  no lesions              Urethra:  normal appearing urethra with no masses, tenderness or lesions              Bartholins and Skenes: normal                 Vagina: normal appearing vagina with normal color and discharge, no lesions              Cervix: no lesions.  Mild blood noted.                 Bimanual Exam:  Uterus:  normal size, contour, position, consistency, mobility, non-tender              Adnexa: no mass, fullness, tenderness              Rectal exam: Yes.  .  Confirms.              Anus:  normal sphincter tone, no lesions  Chaperone was present for exam:  Marchelle Folks, CMA  ASSESSMENT  ASCUS pap, negative HR HPV.  Complex right ovarian cyst.  Endometrial mass.  Menorrhagia with regular menses.   PLAN  We discussed abnormal paps, colposcopy, LEEP.  Declines Gardasil vaccine.  Pap and HR HPV testing in 3 years. Return for pelvic US.  Rationale explained.  Final recommendations for heavy menses to follow after evaluation is complete.   An After Visit Summary was printed and given to the patient.  37 min  total time was spent for this patient encounter, including preparation, face-to-face counseling with the patient, coordination of care, and documentation of the encounter.

## 2022-02-22 ENCOUNTER — Other Ambulatory Visit: Payer: Self-pay | Admitting: Physician Assistant

## 2022-02-22 DIAGNOSIS — R928 Other abnormal and inconclusive findings on diagnostic imaging of breast: Secondary | ICD-10-CM

## 2022-02-26 ENCOUNTER — Other Ambulatory Visit: Payer: Self-pay

## 2022-02-26 DIAGNOSIS — N920 Excessive and frequent menstruation with regular cycle: Secondary | ICD-10-CM

## 2022-02-26 DIAGNOSIS — N9489 Other specified conditions associated with female genital organs and menstrual cycle: Secondary | ICD-10-CM

## 2022-02-27 ENCOUNTER — Encounter: Payer: Self-pay | Admitting: Family Medicine

## 2022-03-05 ENCOUNTER — Ambulatory Visit
Admission: RE | Admit: 2022-03-05 | Discharge: 2022-03-05 | Disposition: A | Discharge status: Home / Self Care | Payer: BC Managed Care – PPO | Source: Ambulatory Visit | Attending: Physician Assistant | Admitting: Physician Assistant

## 2022-03-05 ENCOUNTER — Other Ambulatory Visit: Payer: Self-pay | Admitting: Physician Assistant

## 2022-03-05 DIAGNOSIS — R928 Other abnormal and inconclusive findings on diagnostic imaging of breast: Secondary | ICD-10-CM

## 2022-03-05 DIAGNOSIS — N6320 Unspecified lump in the left breast, unspecified quadrant: Secondary | ICD-10-CM

## 2022-03-05 DIAGNOSIS — N6310 Unspecified lump in the right breast, unspecified quadrant: Secondary | ICD-10-CM

## 2022-03-07 ENCOUNTER — Encounter: Payer: Self-pay | Admitting: Internal Medicine

## 2022-03-14 ENCOUNTER — Ambulatory Visit
Admission: RE | Admit: 2022-03-14 | Discharge: 2022-03-14 | Disposition: A | Discharge status: Home / Self Care | Payer: BC Managed Care – PPO | Source: Ambulatory Visit | Attending: Physician Assistant | Admitting: Physician Assistant

## 2022-03-14 ENCOUNTER — Ambulatory Visit
Admission: RE | Admit: 2022-03-14 | Discharge: 2022-03-14 | Disposition: A | Discharge status: Home / Self Care | Payer: BC Managed Care – PPO | Source: Ambulatory Visit | Attending: Family Medicine | Admitting: Family Medicine

## 2022-03-14 DIAGNOSIS — N6320 Unspecified lump in the left breast, unspecified quadrant: Secondary | ICD-10-CM

## 2022-03-29 ENCOUNTER — Other Ambulatory Visit: Payer: BC Managed Care – PPO | Admitting: Obstetrics and Gynecology

## 2022-03-29 ENCOUNTER — Other Ambulatory Visit: Payer: BC Managed Care – PPO

## 2022-04-25 ENCOUNTER — Encounter: Payer: Self-pay | Admitting: Internal Medicine

## 2022-05-29 ENCOUNTER — Encounter: Payer: Self-pay | Admitting: Internal Medicine

## 2022-05-30 ENCOUNTER — Telehealth: Payer: BC Managed Care – PPO | Admitting: Family Medicine

## 2022-06-05 NOTE — Progress Notes (Signed)
This was a virtual visit. Provider was running 20 minutes behind. Patient left the visit before being seen (just as provider was logging on). She scheduled it during her lunch break.  Visit was cancelled

## 2022-06-06 ENCOUNTER — Telehealth: Payer: BC Managed Care – PPO | Admitting: Family Medicine

## 2022-06-07 ENCOUNTER — Telehealth: Payer: BC Managed Care – PPO | Admitting: Family Medicine

## 2022-06-07 ENCOUNTER — Telehealth: Payer: Self-pay

## 2022-06-07 ENCOUNTER — Encounter: Payer: Self-pay | Admitting: Family Medicine

## 2022-06-07 VITALS — Ht 66.5 in

## 2022-06-07 NOTE — Telephone Encounter (Signed)
Pt. Called at 12:21 wanting to know how long it would be before you were going to get on the virtual apt with her. She said her apt. Was at 11:45 and was told to login for the video visit with you at 12 which she did. She said she had wasted her whole lunch break waiting on it and had to go back to work now. She wanted to know if you could send her some info on mychart about her visit. I told her I would send you a message but did not know if you could do that or not.

## 2022-06-07 NOTE — Telephone Encounter (Signed)
Message sent to patient. Appt was cancelled, so will need her copay back. This should not be rescheduled as a virtual visit. Ideally she should establish with her new PCP and not be set up with me.

## 2022-06-23 ENCOUNTER — Other Ambulatory Visit: Payer: Self-pay | Admitting: Family Medicine

## 2022-06-25 ENCOUNTER — Other Ambulatory Visit: Payer: Self-pay | Admitting: Physician Assistant

## 2022-06-25 MED ORDER — ZOLPIDEM TARTRATE 10 MG PO TABS: 10.0000 mg | ORAL_TABLET | Freq: Every evening | ORAL | 2 refills | Status: DC | PRN

## 2022-06-25 NOTE — Telephone Encounter (Signed)
Refill request last apt 11/30/21.

## 2022-06-25 NOTE — Telephone Encounter (Signed)
Refill request med not in pt. List anymore last apt 11/30/21.

## 2022-07-03 ENCOUNTER — Encounter: Payer: Self-pay | Admitting: Internal Medicine

## 2022-10-10 ENCOUNTER — Telehealth: Payer: BC Managed Care – PPO | Admitting: Family Medicine

## 2022-12-14 ENCOUNTER — Other Ambulatory Visit: Payer: Self-pay | Admitting: Rehabilitation

## 2022-12-14 DIAGNOSIS — R52 Pain, unspecified: Secondary | ICD-10-CM

## 2023-01-21 ENCOUNTER — Other Ambulatory Visit: Payer: Self-pay | Admitting: Family Medicine

## 2023-09-03 NOTE — Progress Notes (Signed)
 GYNECOLOGY  VISIT   HPI: 48 y.o.   Married  [place] female   [phone] with Patient's last menstrual period was 08/28/2023.   here for: cycle. Pt has had issues with pain, heavy flow, and fatigue the last two months. Having pain as well.   Menses last 7 days.  Using pad and tampon both at times and can change every 1 - 2 hours.  Staining through clothing.   Takes ibuprofen  800 mg for the first 3 - 4 days of her cycle.  Also using a heating pad.   Can have postcoital bleeding for a while. No correlation to her cycle.   Not bleeding outside of her cycle time.  No partner change.  Has fatigue and decreased energy.  No dizziness, shortness or breath or headaches.   No prior care for bleeding.   No prior thromboembolic events.   Had a CT for abdominal pain in 2023 and an ill- defined area noted in the uterine fundal region.  She also had signs of possible ruptured ovarian cyst.   GYNECOLOGIC HISTORY: Patient's last menstrual period was 08/28/2023. Contraception:  BTL Menopausal hormone therapy:  n/a Last 2 paps:  11/30/21 ASCUS: HR HPV neg, 01/01/14 neg History of abnormal Pap or positive HPV:  yes Mammogram:  03/05/22 Breast Density Cat C, BI-RADS CAT 4 sus.  Bx showed fibroadenoma of left breast.         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   Chlamydia    General counseling for prescription of oral contraceptives 01/26/2013   Headache(784.0)    Menorrhagia  01/05/2013   STD (sexually transmitted disease)     Past Surgical History:  Procedure Laterality Date   carpel tunnel release     NASAL SINUS SURGERY N/A 01/30/2014   Procedure: IRRIGATION AND DEBRIDEMENT NASAL SEPTAL ABSCESS;  Surgeon: Lenton Rail, MD;  Location: [facility] OR;  Service: ENT;  Laterality: N/A;   TONSILLECTOMY     TUBAL LIGATION      Current Outpatient Medications  Medication Sig Dispense Refill   ibuprofen  (ADVIL ) 600 MG tablet Take 1 tablet (600 mg total) by mouth every 6 (six) hours as needed. 30 tablet 0   zolpidem  (AMBIEN ) 10 MG tablet Take 1 tablet (10 mg total) by mouth at bedtime as needed for up to 90 doses for sleep. (Patient not taking: Reported on 09/04/2023) 30 tablet 2   No current facility-administered medications for this visit.     ALLERGIES: Patient has no known allergies.  Family History  Problem Relation Age of Onset   Asthma Mother    Hypertension Mother    Diabetes Maternal Aunt    Hypertension Maternal Grandmother    Diabetes Maternal Grandfather  Social History   Socioeconomic History   Marital status: Married    Spouse name: Not on file   Number of children: Not on file   Years of education: Not on file   Highest education level: Not on file  Occupational History   Not on file  Tobacco Use   Smoking status: Never   Smokeless tobacco: Never  Vaping Use   Vaping status: Never Used  Substance and Sexual Activity   Alcohol use: Yes    Comment: 2 drinks/month   Drug use: Yes    Types: Marijuana    Comment: occ   Sexual activity: Yes    Birth control/protection: Surgical    Comment: last intercourse 3-4 wks ago, first intercourse>17  Other Topics Concern   Not on file  Social History Narrative   Not on file   Social Drivers of Health   Financial Resource Strain: Not on file  Food Insecurity: Not on file  Transportation Needs: Not on file  Physical Activity: Not on file  Stress: Not on file  Social Connections: Unknown  (02/25/2023)   Received from [facility]   Social Network    Social Network: Not on file  Intimate Partner Violence: Not At Risk (02/25/2023)   Received from [facility]   HITS    Over the last 12 months how often did your partner physically hurt you?: Never    Over the last 12 months how often did your partner insult you or talk down to you?: Never    Over the last 12 months how often did your partner threaten you with physical harm?: Never    Over the last 12 months how often did your partner scream or curse at you?: Never    Review of Systems  All other systems reviewed and are negative.   PHYSICAL EXAMINATION:   BP 124/68 (BP Location: Left Arm, Patient Position: Sitting, Cuff Size: Normal)   Pulse 82   Wt 231 lb (104.8 kg)   LMP 08/28/2023   SpO2 98%   BMI 36.73 kg/m     General appearance: alert, cooperative and appears stated age  Pelvic: External genitalia:  no lesions              Urethra:  normal appearing urethra with no masses, tenderness or lesions              Bartholins and Skenes: normal                 Vagina: normal appearing vagina with normal color and discharge, no lesions              Cervix: no lesions                Bimanual Exam:  Uterus:  normal size, contour, position, consistency, mobility, non-tender              Adnexa: no mass, fullness, tenderness              Rectal exam: Yes.  .  Confirms.              Anus:  normal sphincter tone, no lesions  Chaperone was present for exam:  Emmaline Haring, CMA  ASSESSMENT:  Menorrhagia with regular cycles.  Dysmenorrhea. Postcoital bleeding.  Fatigue.  PLAN:  We discussed heavy and painful periods and possible etiologies of fibroids, endometriosis.  Her prior CT was reviewed.  Declines Lysteda. Motrin  800 mg every 8 hour prn.  We briefly reviewed  some tx options birth control, Mirena IUD, endometrial ablation, hysterectomy.   Will proceed with pelvic ultrasound to define her bleeding further and then  review appropriate treatment choices. She will also have a future annual exam and mammogram update.  32 min  total time was spent for this patient encounter, including preparation, face-to-face counseling with the patient, coordination of care, and documentation of the encounter.

## 2023-09-04 ENCOUNTER — Ambulatory Visit: Payer: 59 | Admitting: Obstetrics and Gynecology

## 2023-09-04 ENCOUNTER — Other Ambulatory Visit: Payer: Self-pay | Admitting: Obstetrics and Gynecology

## 2023-09-04 ENCOUNTER — Encounter: Payer: Self-pay | Admitting: Obstetrics and Gynecology

## 2023-09-04 VITALS — BP 124/68 | HR 82 | Wt 231.0 lb

## 2023-09-04 DIAGNOSIS — N930 Postcoital and contact bleeding: Secondary | ICD-10-CM | POA: Diagnosis not present

## 2023-09-04 DIAGNOSIS — N920 Excessive and frequent menstruation with regular cycle: Secondary | ICD-10-CM

## 2023-09-04 DIAGNOSIS — Z1231 Encounter for screening mammogram for malignant neoplasm of breast: Secondary | ICD-10-CM

## 2023-09-04 DIAGNOSIS — N946 Dysmenorrhea, unspecified: Secondary | ICD-10-CM

## 2023-09-04 DIAGNOSIS — R5383 Other fatigue: Secondary | ICD-10-CM | POA: Diagnosis not present

## 2023-09-04 MED ORDER — IBUPROFEN 800 MG PO TABS: 800.0000 mg | ORAL_TABLET | Freq: Three times a day (TID) | ORAL | 1 refills | Status: DC | PRN

## 2023-09-04 NOTE — Patient Instructions (Signed)
Menorrhagia Menorrhagia is a form of abnormal uterine bleeding in which menstrual periods are heavy or last longer than normal. With menorrhagia, the periods may cause enough blood loss and cramping that a woman becomes unable to take part in her usual activities. What are the causes? Common causes of this condition include: Polyps or fibroids. These are noncancerous growths in the uterus. An imbalance of the hormones estrogen and progesterone. Anovulation, which occurs when one of the ovaries does not release an egg during one or more months. A problem with the thyroid gland (hypothyroidism). Side effects of having an intrauterine device (IUD). Side effects of some medicines, such as NSAIDs or blood thinners. A bleeding disorder that stops the blood from clotting normally. In some cases, the cause of this condition is not known. What increases the risk? You are more likely to develop this condition if you have cancer of the uterus. What are the signs or symptoms? Symptoms of this condition include: Routinely having to change your pad or tampon every 1-2 hours because it is soaked. Needing to use pads and tampons at the same time because of heavy bleeding. Needing to wake up to change your pads or tampons during the night. Passing blood clots larger than 1 inch (2.5 cm) in size. Having bleeding that lasts for more than 7 days. Having symptoms of low iron levels (anemia), such as tiredness (fatigue) or shortness of breath. How is this diagnosed? This condition may be diagnosed based on: A physical exam. Your symptoms and menstrual history. Tests, such as: Blood tests to check if you are pregnant or if you have hormonal changes, a bleeding or thyroid disorder, anemia, or other problems. Pap test to check for cancerous changes, infections, or inflammation. Endometrial biopsy. This test involves removing a tissue sample from the lining of the uterus (endometrium) to be examined under a  microscope. Pelvic ultrasound. This test uses sound waves to create images of your uterus, ovaries, and vagina. The images can show if you have fibroids or other growths. Hysteroscopy. For this test, a thin, flexible tube with a light on the end (hysteroscope) is used to look inside your uterus. How is this treated? Treatment may not be needed for this condition. If it is needed, the best treatment for you will depend on: Whether you need to prevent pregnancy. Your desire to have children in the future. The cause and severity of your bleeding. Your personal preference. Medicine Medicines are the first step in treatment. You may be treated with: Hormonal birth control methods. These treatments reduce bleeding during your menstrual period. They include: Birth control pills. Skin patch. Vaginal ring. Shots (injections) that you get every 3 months. Hormonal IUD. Implants that go under the skin. Medicines that thicken the blood and slow bleeding. Medicines that reduce swelling, such as ibuprofen. Medicines that contain an artificial (synthetic) hormone called progestin. Medicines that make the ovaries stop working for a short time. Iron supplements to treat anemia.  Surgery If medicines do not work, surgery may be done. Surgical options may include: Dilation and curettage (D&C). In this procedure, your health care provider opens the lowest part of the uterus (cervix) and then scrapes or suctions tissue from the endometrium. This reduces menstrual bleeding. Operative hysteroscopy. In this procedure, a hysteroscope is used to view your uterus and help remove polyps that may be causing heavy periods. Endometrial ablation. This is when various techniques are used to permanently destroy your entire endometrium. After endometrial ablation, most women have little   or no menstrual flow. This procedure reduces your ability to become pregnant. Endometrial resection. In this procedure, an electrosurgical  wire loop is used to remove the endometrium. This procedure reduces your ability to become pregnant. Hysterectomy. This is surgical removal of your uterus. This is a permanent procedure that stops menstrual periods. Pregnancy is not possible after a hysterectomy. Follow these instructions at home: Medicines Take over-the-counter and prescription medicines only as told by your health care provider. This includes iron pills. Do not change or switch medicines without asking your health care provider. Do not take aspirin or medicines that contain aspirin 1 week before or during your menstrual period. Aspirin may make bleeding worse. Managing constipation Your iron pills may cause constipation. If you are taking prescription iron supplements, you may need to take these actions to prevent or treat constipation: Drink enough fluid to keep your urine pale yellow. Take over-the-counter or prescription medicines. Eat foods that are high in fiber, such as beans, whole grains, and fresh fruits and vegetables. Limit foods that are high in fat and processed sugars, such as fried or sweet foods. General instructions If you need to change your sanitary pad or tampon more than once every 2 hours, limit your activity until the bleeding stops. Eat well-balanced meals, including foods that are high in iron. Foods that have a lot of iron include leafy green vegetables, meat, liver, eggs, and whole-grain breads and cereals. Do not try to lose weight until the abnormal bleeding has stopped and your blood iron level is back to normal. If you need to lose weight, work with your health care provider to lose weight safely. Keep all follow-up visits. This is important. Contact a health care provider if: You soak through a pad or tampon every 1 or 2 hours, and this happens every time you have a period. You need to use pads and tampons at the same time because you are bleeding so much. You have nausea, vomiting, diarrhea, or  other problems related to medicines you are taking. Get help right away if: You soak through more than a pad or tampon in 1 hour. You pass clots bigger than 1 inch (2.5 cm) wide. You feel short of breath. You feel like your heart is beating too fast. You feel dizzy or you faint. You feel very weak or tired. Summary Menorrhagia is a form of abnormal uterine bleeding in which menstrual periods are heavy or last longer than normal. Treatment may not be needed for this condition. If it is needed, it may include medicines or procedures. Take over-the-counter and prescription medicines only as told by your health care provider. This includes iron pills. Get help right away if you have heavy bleeding that soaks through more than a pad or tampon in 1 hour, you pass large clots, or you feel dizzy, short of breath, or very weak or tired. This information is not intended to replace advice given to you by your health care provider. Make sure you discuss any questions you have with your health care provider. Document Revised: 04/19/2020 Document Reviewed: 04/19/2020 Elsevier Patient Education  2024 [employer].

## 2023-09-05 ENCOUNTER — Other Ambulatory Visit: Payer: Self-pay | Admitting: Obstetrics and Gynecology

## 2023-09-05 DIAGNOSIS — D72829 Elevated white blood cell count, unspecified: Secondary | ICD-10-CM

## 2023-09-05 LAB — CBC
HCT: 40.6 % (ref 35.0–45.0)
Hemoglobin: 13.1 g/dL (ref 11.7–15.5)
MCH: 28.2 pg (ref 27.0–33.0)
MCHC: 32.3 g/dL (ref 32.0–36.0)
MCV: 87.3 fL (ref 80.0–100.0)
MPV: 10.9 fL (ref 7.5–12.5)
Platelets: 350 10*3/uL (ref 140–400)
RBC: 4.65 10*6/uL (ref 3.80–5.10)
RDW: 13.1 % (ref 11.0–15.0)
WBC: 3.7 10*3/uL — ABNORMAL LOW (ref 3.8–10.8)

## 2023-09-05 LAB — FERRITIN: Ferritin: 9 ng/mL — ABNORMAL LOW (ref 16–232)

## 2023-09-05 LAB — TSH: TSH: 1.61 m[IU]/L

## 2023-09-05 LAB — IRON: Iron: 67 ug/dL (ref 40–190)

## 2023-09-09 ENCOUNTER — Ambulatory Visit (INDEPENDENT_AMBULATORY_CARE_PROVIDER_SITE_OTHER): Payer: 59 | Admitting: Podiatry

## 2023-09-09 ENCOUNTER — Ambulatory Visit (INDEPENDENT_AMBULATORY_CARE_PROVIDER_SITE_OTHER): Payer: 59

## 2023-09-09 ENCOUNTER — Encounter: Payer: Self-pay | Admitting: Podiatry

## 2023-09-09 DIAGNOSIS — M7752 Other enthesopathy of left foot: Secondary | ICD-10-CM | POA: Diagnosis not present

## 2023-09-10 NOTE — Progress Notes (Signed)
   Chief Complaint  Patient presents with   Ankle Pain    Anterior ankle left - aching x 6 months, no injury, some swelling, worsening, noticeable more after standing after sitting, no treatment   New Patient (Initial Visit)    HPI: 48 y.o. female presenting today for evaluation of left anterior ankle pain ongoing for about 6 months now.  No history of injury.  Idiopathic gradual onset.  She says that her pain is intermittent.  She has not anything for treatment.  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  Dermatology: Skin is warm, dry and supple bilateral lower extremities.   Vascular: Palpable pedal pulses bilaterally. Capillary refill within normal limits.  No appreciable edema.  No erythema.  Neurological: Grossly intact via light touch  Musculoskeletal Exam: No pedal deformities noted.  Today there is no tenderness or pain with palpation throughout the foot or ankle.  Range of motion WNL.  Muscle strength 5/5 all compartments.  Extensor tendons are nontender with palpation.  No crepitus with range of motion of the ankle or subtalar joint.  Radiographic Exam LT foot and ankle 09/09/2023:  Normal osseous mineralization. Joint spaces preserved.  No fractures or osseous irregularities noted.  Impression: Negative  Assessment/Plan of  Care: 1.  Otherwise normal foot exam 2.  Possible extensor tendinitis left ankle  -Patient evaluated.  X-rays reviewed -Unfortunately I am unable to elicit any of the patient's symptoms that she experiences intermittently.  Today she is essentially asymptomatic with normal foot exam -I do suspect that the patient may be experiencing intermittent tendinitis possibly depending on activity or shoe gear -Recommend OTC Motrin as needed -Recommend good supportive shoes and sneakers -Return to clinic as needed       Felecia Shelling, DPM [facility]  Dr. Felecia Shelling, DPM    [address] [place], [place] [address]                Office [phone]  Fax [phone]

## 2023-09-12 ENCOUNTER — Other Ambulatory Visit: Payer: Self-pay | Admitting: Obstetrics and Gynecology

## 2023-09-12 DIAGNOSIS — D72819 Decreased white blood cell count, unspecified: Secondary | ICD-10-CM

## 2023-09-16 ENCOUNTER — Encounter: Payer: Self-pay | Admitting: Obstetrics and Gynecology

## 2023-09-16 NOTE — Progress Notes (Unsigned)
GYNECOLOGY  VISIT   HPI: 48 y.o.   Married  {RACE/ETHNICITY:22866} female   [phone] with Patient's last menstrual period was 08/28/2023.   here for: ***     GYNECOLOGIC HISTORY: Patient's last menstrual period was 08/28/2023. Contraception:  *** Menopausal hormone therapy:  *** Last 2 paps:  *** History of abnormal Pap or positive HPV:  {YES NO:22349} Mammogram:  ***        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   Chlamydia    General counseling for prescription of oral contraceptives 01/26/2013   Headache(784.0)    Menorrhagia 01/05/2013   STD (sexually transmitted disease)     Past Surgical History:  Procedure Laterality Date   carpel tunnel release     NASAL SINUS SURGERY N/A 01/30/2014   Procedure: IRRIGATION AND DEBRIDEMENT NASAL SEPTAL ABSCESS;  Surgeon: Flo Shanks, MD;  Location: [facility] OR;  Service: ENT;  Laterality: N/A;   TONSILLECTOMY     TUBAL LIGATION      Current Outpatient Medications  Medication Sig Dispense Refill   ibuprofen (ADVIL) 800 MG tablet Take 1 tablet (800 mg total) by mouth every 8 (eight) hours as needed. 30 tablet 1   zolpidem (AMBIEN) 10 MG tablet Take 1 tablet (10 mg total) by mouth at bedtime as needed for up to 90 doses for sleep. (Patient not taking: Reported on 09/04/2023) 30 tablet 2   No current facility-administered medications for this visit.     ALLERGIES: Patient has no known allergies.  Family History  Problem Relation Age of  Onset   Asthma Mother    Hypertension Mother    Diabetes Maternal Aunt    Hypertension Maternal Grandmother    Diabetes Maternal Grandfather     Social History   Socioeconomic History   Marital status: Married    Spouse name: Not on file   Number of children: Not on file   Years of education: Not on file   Highest education level: Not on file  Occupational History   Not on file  Tobacco Use   Smoking status: Never   Smokeless tobacco: Never  Vaping Use   Vaping status: Never Used  Substance and Sexual Activity   Alcohol use: Yes    Comment: 2 drinks/month   Drug use: Yes    Types: Marijuana    Comment: occ   Sexual activity: Yes    Birth control/protection: Surgical    Comment: last intercourse 3-4 wks ago, first intercourse>17  Other Topics Concern   Not on file  Social History Narrative   Not on file   Social Drivers of Health   Financial Resource Strain: Not on file  Food Insecurity: Not on file  Transportation Needs:  Not on file  Physical Activity: Not on file  Stress: Not on file  Social Connections: Unknown (02/25/2023)   Received from [facility]   Social Network    Social Network: Not on file  Intimate Partner Violence: Not At Risk (02/25/2023)   Received from [facility]   HITS    Over the last 12 months how often did your partner physically hurt you?: Never    Over the last 12 months how often did your partner insult you or talk down to you?: Never    Over the last 12 months how often did your partner threaten you with physical harm?: Never    Over the last 12 months how often did your partner scream or curse at you?: Never    Review of Systems  PHYSICAL EXAMINATION:   LMP 08/28/2023     General appearance: alert, cooperative and appears stated age Head: Normocephalic, without obvious abnormality, atraumatic Neck: no adenopathy, supple, symmetrical, trachea midline and thyroid normal to inspection and palpation Lungs: clear to auscultation  bilaterally Breasts: normal appearance, no masses or tenderness, No nipple retraction or dimpling, No nipple discharge or bleeding, No axillary or supraclavicular adenopathy Heart: regular rate and rhythm Abdomen: soft, non-tender, no masses,  no organomegaly Extremities: extremities normal, atraumatic, no cyanosis or edema Skin: Skin color, texture, turgor normal. No rashes or lesions Lymph nodes: Cervical, supraclavicular, and axillary nodes normal. No abnormal inguinal nodes palpated Neurologic: Grossly normal  Pelvic: External genitalia:  no lesions              Urethra:  normal appearing urethra with no masses, tenderness or lesions              Bartholins and Skenes: normal                 Vagina: normal appearing vagina with normal color and discharge, no lesions              Cervix: no lesions                Bimanual Exam:  Uterus:  normal size, contour, position, consistency, mobility, non-tender              Adnexa: no mass, fullness, tenderness              Rectal exam: {yes no:314532}.  Confirms.              Anus:  normal sphincter tone, no lesions  Chaperone was present for exam:  {BSCHAPERONE:31226::"Emily F, CMA"}  ASSESSMENT:    PLAN:    {LABS (Optional):23779}  ***  total time was spent for this patient encounter, including preparation, face-to-face counseling with the patient, coordination of care, and documentation of the encounter.

## 2023-09-17 ENCOUNTER — Ambulatory Visit: Payer: 59

## 2023-09-17 ENCOUNTER — Ambulatory Visit: Payer: 59 | Admitting: Obstetrics and Gynecology

## 2023-09-17 ENCOUNTER — Other Ambulatory Visit: Payer: 59

## 2023-09-17 ENCOUNTER — Encounter: Payer: Self-pay | Admitting: Obstetrics and Gynecology

## 2023-09-17 ENCOUNTER — Other Ambulatory Visit: Payer: 59 | Admitting: Obstetrics and Gynecology

## 2023-09-17 VITALS — BP 132/80 | HR 78 | Wt 231.0 lb

## 2023-09-17 DIAGNOSIS — N921 Excessive and frequent menstruation with irregular cycle: Secondary | ICD-10-CM

## 2023-09-17 DIAGNOSIS — N920 Excessive and frequent menstruation with regular cycle: Secondary | ICD-10-CM | POA: Diagnosis not present

## 2023-09-17 DIAGNOSIS — D219 Benign neoplasm of connective and other soft tissue, unspecified: Secondary | ICD-10-CM | POA: Diagnosis not present

## 2023-09-17 DIAGNOSIS — R9389 Abnormal findings on diagnostic imaging of other specified body structures: Secondary | ICD-10-CM | POA: Diagnosis not present

## 2023-09-17 NOTE — Patient Instructions (Addendum)
Endometrial Biopsy  An endometrial biopsy is a procedure where a tissue sample is removed from the lining of the uterus. This lining is called the endometrium. The tissue sample is then sent to a lab for testing. You may have this type of biopsy to check for: Cancer. Infection. Growths called polyps. Uterine bleeding that can't be explained. Tell a health care provider about: Any allergies you have. All medicines you're taking including vitamins, herbs, eye drops, creams, and over-the-counter medicines. Any problems you or family members have had with anesthesia. Any bleeding problems you have. Any surgeries you have had. Any medical problems you have. Whether you're pregnant or may be pregnant. What are the risks? Your health care provider will talk with you about risks. These may include: Bleeding. Infection. Allergic reactions to medicines. Damage to the wall of the uterus. This is rare. What happens before the procedure? Keep track of your period. You may need to have this biopsy when you're not having your period. Ask your provider about: Changing or stopping your regular medicines. These include any diabetes medicines or blood thinners you take. Taking medicines such as aspirin and ibuprofen. These medicines can thin your blood. Do not take them unless your provider tells you to. Taking over-the-counter medicines, vitamins, herbs, and supplements. Bring a pad with you. You may need to wear one after the biopsy. Plan to have a responsible adult take you home from the hospital or clinic. You won't be allowed to drive. What happens during the procedure? A tool will be put into your vagina to hold it open. This helps your provider see the cervix. The cervix is the lowest part of the uterus. Your cervix will be cleaned with a solution that kills germs. You will be given anesthesia. This keeps you from feeling pain. It will numb your cervix. A tool called forceps will be used to  hold your cervix steady. A thin tool called a uterine sound will be put through your cervix. It will be used to: Find the length of your uterus. Find where to take the sample from. A soft tube called a catheter will be put into your uterus. The catheter will remove a tissue sample. The tube and tools will be removed. The sample will be sent to a lab for testing. The procedure may vary among providers and hospitals. What happens after the procedure? Your blood pressure, heart rate, breathing rate, and blood oxygen level will be monitored until you leave the hospital or clinic. It's up to you to get the results of your procedure. Ask your provider, or the department that is doing the procedure, when your results will be ready. This information is not intended to replace advice given to you by your health care provider. Make sure you discuss any questions you have with your health care provider. Document Revised: 10/16/2022 Document Reviewed: 10/16/2022 Elsevier Patient Education  2024 Elsevier Inc.  Hysteroscopy Hysteroscopy is a procedure used to look inside a person's uterus. It should be done right after your period. You may have this procedure if your health care provider wants to: Look for tumors and other growths in the uterus. Know more about your bleeding, fibroids, tumors, polyps, scar tissue, or cancer in the uterus. Know why you aren't able to get pregnant, or why you lose your pregnancies. Find an intrauterine device (IUD) in your body. Put a birth control device in the fallopian tubes. Tell a health care provider about: Any allergies you have. All medicines you are  taking. These include vitamins, herbs, eye drops, creams, and over-the-counter medicines. Any problems you or family members have had with anesthesia. Any bleeding problems you have. Any medical conditions or surgeries you've had. Whether you're pregnant or may be pregnant. Whether you have or have had a sexually  transmitted infection (STI), or you think you have an STI. What are the risks? Your provider will talk with you about risks. These may include: Bleeding that's worse. Infection. Damage to parts of the uterus or other organs. Allergies to medicines or fluids that are used in the procedure. What happens before the procedure? When to stop eating and drinking Follow instructions from your provider about what you may eat and drink. These may include: 8 hours before your procedure Stop eating most foods. Do not eat meat, fried foods, or fatty foods. Eat only light foods, such as toast or crackers. All liquids are okay except energy drinks and alcohol. 6 hours before your procedure Stop eating. Drink only clear liquids, such as water, clear fruit juice, black coffee, plain tea, and sports drinks. Do not drink energy drinks or alcohol. 2 hours before your procedure Stop drinking all liquids. You may be allowed to take medicines with small sips of water. If you do not follow your provider's instructions, your procedure may be delayed or canceled. Medicines Ask your provider about: Changing or stopping your regular medicines. These include any diabetes medicines or blood thinners you take. Taking medicines such as aspirin and ibuprofen. These medicines can thin your blood. Do not take them unless your provider tells you to. Taking over-the-counter medicines, vitamins, herbs, and supplements. Medicine may be placed in your cervix the day before the procedure. This medicine causes the cervix to open (dilate). The larger opening makes it easier for the hysteroscope to be inserted into the uterus. General instructions Ask your provider what steps will be taken to help prevent infection. These steps may include: Washing skin with a soap that kills germs. Taking antibiotic medicine. Do not smoke, vape, or use any products that have nicotine or tobacco for at least 4 weeks before the surgery. If you  need help quitting, ask your provider. If you will be going home right after the procedure, plan to have an adult: Take you home from the hospital or clinic. You will not be allowed to drive. Care for you for the time you are told. Pee before the procedure begins. What happens during the procedure? You may be given: A sedative. This helps you relax. Anesthesia. This keeps you from feeling pain. It will make you fall asleep for surgery. A small tube with a light and camera called a hysteroscope will be put into your uterus. The camera sends images to a screen in the room so your provider can see the inside of your uterus. Air or fluid will be used to enlarge your uterus to allow your provider to see it better. In some cases, tissue may be gently scraped from inside the uterus and sent to a lab for testing (biopsy). The procedure may vary among providers and hospitals. What happens after the procedure? Your blood pressure, heart rate, breathing rate, and blood oxygen level will be monitored until you leave the hospital or clinic. You may have cramps. You may be given medicines for this. You may have bleeding. Bleeding may be light or heavy. This is normal. If you had a biopsy, it is up to you to get the results. Ask your provider, or the department that  is doing the procedure, when your results will be ready. This information is not intended to replace advice given to you by your health care provider. Make sure you discuss any questions you have with your health care provider. Document Revised: 12/07/2022 Document Reviewed: 12/07/2022 Elsevier Patient Education  2024 Elsevier Inc.  Endometrial Ablation: What to Expect An endometrial ablation is a procedure that removes the thin layer of the lining of the uterus. This lining is called the endometrium. This procedure may be done to: Stop heavy periods that can cause low red blood cell count (anemia). Try to stop irregular bleeding from your  vagina. Treat bleeding that's caused by small tumors (fibroids) in the uterus. After this procedure, you may not be able to have children. Tell a health care provider about: Any allergies you have. All medicines you're taking. These include vitamins, herbs, eye drops, creams, and over-the-counter medicines. Any problems you or family members have had with anesthesia. Any bleeding problems you have. Any surgeries you have had. Any medical conditions you have. Whether you're pregnant or may be pregnant. What are the risks? Your health care provider will talk with you about risks. These may include: Bleeding. Infection. Allergic reactions to medicines. Injury to the uterus, bowel, or nearby structures. An air bubble in the lung (air embolus). Problems with pregnancy. Scar tissue. What happens before the procedure? Medicines Ask about changing or stopping: Any medicines you take. Any vitamins, herbs, or supplements you take. Do not take aspirin or ibuprofen unless you're told to. Tests You will have tests of your uterus to make sure that there's no cancer. You may have an ultrasound of the uterus. Surgery safety For your safety, you may: Need to wash your skin with a soap that kills germs. Get antibiotics. Have hair removed at the procedure site. General instructions Do not smoke, vape, or use nicotine or tobacco. You may be given medicines to thin the lining of the uterus. If you'll be going home right after the procedure, plan to have a responsible adult: Drive you home from the hospital or clinic. You won't be allowed to drive. Stay with you for the time you're told. What happens during the procedure?  You will lie on an exam table with your feet and legs supported. An IV will be inserted into one of your veins. You will be given a sedative to help you relax. A surgical tool with a light and camera (resectoscope) will be inserted into your vagina and moved into your  uterus. This allows your surgeon to see inside your uterus. The lining of your uterus will be taken out. Your surgeon will use one of these methods: Radiofrequency. This uses an electric current to destroy the lining of the uterus. Cryotherapy. This uses something very cold to freeze the lining of the uterus. Heated fluid. This uses very hot salt and water solution (saline) solution to destroy the lining of the uterus. Microwave. This uses high-energy waves to heat up the lining of the uterus and destroy it. Thermal balloon. A balloon filled with heated fluid is inserted into the uterus. The heated fluid destroys the lining of the uterus. These steps may vary. Ask what you can expect. What happens after the procedure? You may be watched closely until you leave. This includes checking your pain level, blood pressure, heart rate, and breathing rate. If you were given a sedative, do not drive or use machines until you're told it's safe. A sedative can make you sleepy. This  information is not intended to replace advice given to you by your health care provider. Make sure you discuss any questions you have with your health care provider. Document Revised: 01/22/2023 Document Reviewed: 01/22/2023 Elsevier Patient Education  2024 [employer].

## 2023-09-18 ENCOUNTER — Ambulatory Visit
Admission: RE | Admit: 2023-09-18 | Discharge: 2023-09-18 | Disposition: A | Discharge status: Home / Self Care | Payer: 59 | Source: Ambulatory Visit

## 2023-09-18 DIAGNOSIS — Z1231 Encounter for screening mammogram for malignant neoplasm of breast: Secondary | ICD-10-CM

## 2023-09-19 ENCOUNTER — Encounter: Payer: Self-pay | Admitting: Obstetrics and Gynecology

## 2023-09-24 ENCOUNTER — Ambulatory Visit: Payer: 59 | Admitting: Family Medicine

## 2023-09-24 ENCOUNTER — Encounter: Payer: Self-pay | Admitting: Family Medicine

## 2023-09-24 VITALS — BP 128/84 | HR 70 | Temp 97.8°F | Ht 66.5 in | Wt 231.0 lb

## 2023-09-24 DIAGNOSIS — D2230 Melanocytic nevi of unspecified part of face: Secondary | ICD-10-CM | POA: Diagnosis not present

## 2023-09-24 DIAGNOSIS — L918 Other hypertrophic disorders of the skin: Secondary | ICD-10-CM

## 2023-09-24 DIAGNOSIS — E669 Obesity, unspecified: Secondary | ICD-10-CM | POA: Diagnosis not present

## 2023-09-24 DIAGNOSIS — F5101 Primary insomnia: Secondary | ICD-10-CM | POA: Diagnosis not present

## 2023-09-24 DIAGNOSIS — R002 Palpitations: Secondary | ICD-10-CM

## 2023-09-24 MED ORDER — METFORMIN HCL ER 500 MG PO TB24: 500.0000 mg | ORAL_TABLET | Freq: Every day | ORAL | 0 refills | Status: DC

## 2023-09-24 MED ORDER — TRAZODONE HCL 50 MG PO TABS: 50.0000 mg | ORAL_TABLET | Freq: Every day | ORAL | 0 refills | Status: DC

## 2023-09-24 MED ORDER — ZEPBOUND 2.5 MG/0.5ML ~~LOC~~ SOAJ: 2.5000 mg | SUBCUTANEOUS | 1 refills | Status: DC

## 2023-09-24 NOTE — Progress Notes (Signed)
 Subjective:     Patient ID: Jasmine Gentry, female    DOB: 13-Aug-1976, 48 y.o.   MRN: [id number]  Chief Complaint  Patient presents with   Establish Care    Wants to discuss weight loss, tried dieting, working out and intermittent fasting. Only able to lose 10-15 lbs.   Also wants something to take so she can sleep, last provider gave her ambien  and will smoke marijuana to help. States she stays up and worries  Possible dermatology referral for handing moles near eyes    HPI   History of Present Illness         Here to establish care. I saw her in 2022 at my old practice for a CPE.  She is interested in weight loss medication.    States she has tried losing weight and has lost some but always regains the weight.  Tried intermittent fasting and other diets.  She has exercised in the past but not currently   IDA- diagnosed at OB/GYN. Has not started on iron supplement yet.  Hx of fatigue and palpitations.   Reports having difficulty staying asleep. Falls asleep ok.  Wakes up worrying and having anxiety  She would like to see a dermatologist due to moles and skin tags on face.    Works for [employer]     Health Maintenance Due  Topic Date Due   Colonoscopy  Never done    Past Medical History:  Diagnosis Date   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   Chlamydia    General counseling for prescription of oral contraceptives 01/26/2013   Headache(784.0)    Menorrhagia 01/05/2013   STD (sexually transmitted disease)     Past Surgical History:  Procedure Laterality Date   BREAST BIOPSY Left    carpel tunnel release     NASAL SINUS SURGERY N/A 01/30/2014   Procedure: IRRIGATION AND DEBRIDEMENT NASAL SEPTAL ABSCESS;  Surgeon: Marlyce Finer, MD;  Location: [facility] OR;  Service: ENT;  Laterality: N/A;   TONSILLECTOMY     TUBAL LIGATION      Family History  Problem Relation Age of Onset   Asthma Mother    Hypertension Mother     Diabetes Maternal Aunt    Hypertension Maternal Grandmother    Diabetes Maternal Grandfather     Social History   Socioeconomic History   Marital status: Married    Spouse name: Not on file   Number of children: Not on file   Years of education: Not on file   Highest education level: Not on file  Occupational History   Not on file  Tobacco Use   Smoking status: Never   Smokeless tobacco: Never  Vaping Use   Vaping status: Never Used  Substance and Sexual Activity   Alcohol use: Not Currently    Comment: Occasionally   Drug use: Yes    Frequency: 5.0 times per week    Types: Marijuana    Comment: For sleep   Sexual activity: Yes    Birth control/protection: None    Comment: last intercourse 3-4 wks ago, first intercourse>17  Other Topics Concern   Not on file  Social History Narrative   Not on file   Social Drivers of Health   Financial Resource Strain: Patient Declined (09/24/2023)   Overall Financial Resource Strain (CARDIA)    Difficulty of Paying Living Expenses: Patient declined  Food Insecurity: Patient Declined (09/24/2023)   Hunger Vital Sign  Worried About Programme Researcher, Broadcasting/film/video in the Last Year: Patient declined    Barista in the Last Year: Patient declined  Transportation Needs: Patient Declined (09/24/2023)   PRAPARE - Administrator, Civil Service (Medical): Patient declined    Lack of Transportation (Non-Medical): Patient declined  Physical Activity: Unknown (09/24/2023)   Exercise Vital Sign    Days of Exercise per Week: 0 days    Minutes of Exercise per Session: Not on file  Stress: Stress Concern Present (09/24/2023)   [employer] of Occupational Health - Occupational Stress Questionnaire    Feeling of Stress : To some extent  Social Connections: Unknown (09/24/2023)   Social Connection and Isolation Panel [NHANES]    Frequency of Communication with Friends and Family: More than three times a week    Frequency of Social  Gatherings with Friends and Family: Patient declined    Attends Religious Services: Patient declined    Database Administrator or Organizations: No    Attends Engineer, Structural: Not on file    Marital Status: Married  Catering Manager Violence: Not At Risk (02/25/2023)   Received from [facility]   HITS    Over the last 12 months how often did your partner physically hurt you?: Never    Over the last 12 months how often did your partner insult you or talk down to you?: Never    Over the last 12 months how often did your partner threaten you with physical harm?: Never    Over the last 12 months how often did your partner scream or curse at you?: Never    Outpatient Medications Prior to Visit  Medication Sig Dispense Refill   ibuprofen  (ADVIL ) 800 MG tablet Take 1 tablet (800 mg total) by mouth every 8 (eight) hours as needed. 30 tablet 1   zolpidem  (AMBIEN ) 10 MG tablet Take 1 tablet (10 mg total) by mouth at bedtime as needed for up to 90 doses for sleep. 30 tablet 2   No facility-administered medications prior to visit.    No Known Allergies  Review of Systems  Constitutional:  Positive for malaise/fatigue. Negative for chills, fever and weight loss.  Respiratory:  Negative for shortness of breath.   Cardiovascular:  Negative for chest pain, palpitations and leg swelling.  Gastrointestinal:  Negative for abdominal pain, constipation, diarrhea, nausea and vomiting.  Genitourinary:  Negative for dysuria, frequency and urgency.  Neurological:  Negative for dizziness.       Objective:    Physical Exam Constitutional:      General: She is not in acute distress.    Appearance: She is not ill-appearing.  Eyes:     Extraocular Movements: Extraocular movements intact.     Conjunctiva/sclera: Conjunctivae normal.  Cardiovascular:     Rate and Rhythm: Normal rate and regular rhythm.  Pulmonary:     Effort: Pulmonary effort is normal.     Breath sounds: Normal breath  sounds.  Musculoskeletal:     Cervical back: Normal range of motion and neck supple.     Right lower leg: No edema.     Left lower leg: No edema.  Skin:    General: Skin is warm and dry.  Neurological:     General: No focal deficit present.     Mental Status: She is alert and oriented to person, place, and time.     Motor: No weakness.     Coordination: Coordination normal.  Gait: Gait normal.  Psychiatric:        Mood and Affect: Mood normal.        Behavior: Behavior normal.        Thought Content: Thought content normal.      BP 128/84 (BP Location: Left Arm, Patient Position: Sitting, Cuff Size: Large)   Pulse 70   Temp 97.8 F (36.6 C) (Temporal)   Ht 5' 6.5 (1.689 m)   Wt 231 lb (104.8 kg)   LMP 09/13/2023   SpO2 99%   BMI 36.73 kg/m  Wt Readings from Last 3 Encounters:  09/24/23 231 lb (104.8 kg)  09/17/23 231 lb (104.8 kg)  09/04/23 231 lb (104.8 kg)       Assessment & Plan:   Problem List Items Addressed This Visit     Primary insomnia   Other Visit Diagnoses       Obesity (BMI 30-39.9)    -  Primary   Relevant Medications   metFORMIN  (GLUCOPHAGE -XR) 500 MG 24 hr tablet     Melanocytic nevi of face       Relevant Orders   Ambulatory referral to Dermatology     Intermittent palpitations         Multiple acquired skin tags       Relevant Orders   Ambulatory referral to Dermatology      Reviewed OB/GYN notes and lab results.  Recommend she start on oral iron supplement.  Prescribed Zepbound  but she received a text from the pharmacy that it was not covered and would cost over $1,000  She will start metformin .  Trazodone  for sleep.  Referral to dermatology for skin tags and numerous small nevi on face.  Declines referral to GI for colon cancer screening, discuss at follow up Declines flu shot   I have discontinued Lamyah K. P[employer]'s zolpidem  and Zepbound . I am also having her start on traZODone  and metFORMIN . Additionally, I am  having her maintain her ibuprofen .  Meds ordered this encounter  Medications   DISCONTD: tirzepatide  (ZEPBOUND ) 2.5 MG/0.5ML Pen    Sig: Inject 2.5 mg into the skin once a week.    Dispense:  2 mL    Refill:  1    Supervising Provider:   ROLLENE NORRIS A [[id number]]   traZODone  (DESYREL ) 50 MG tablet    Sig: Take 1 tablet (50 mg total) by mouth at bedtime.    Dispense:  90 tablet    Refill:  0    Supervising Provider:   ROLLENE NORRIS A [[id number]]   metFORMIN  (GLUCOPHAGE -XR) 500 MG 24 hr tablet    Sig: Take 1 tablet (500 mg total) by mouth daily with breakfast.    Dispense:  90 tablet    Refill:  0    Supervising Provider:   ROLLENE NORRIS A [[id number]]

## 2023-09-24 NOTE — Patient Instructions (Addendum)
 Start taking over-the-counter iron.  Start metformin  once daily with breakfast.   Make sure you are cutting back on sugar and carbohydrates such as potatoes, bread, pasta and rice.  Try to get at least 150 minutes of physical activity each week.  Try trazodone  for sleep.

## 2023-10-02 NOTE — Progress Notes (Deleted)
 GYNECOLOGY  VISIT   HPI: 48 y.o.   Married  [place] female   [phone] with Patient's last menstrual period was 09/13/2023.   here for: endo bx     GYNECOLOGIC HISTORY: Patient's last menstrual period was 09/13/2023. Contraception:  n/a Menopausal hormone therapy:  n/a Last 2 paps:  11/30/21 ASCUS: HR HPV neg, 01/01/14 neg History of abnormal Pap or positive HPV:  yes Mammogram:  09/18/23 Breast Density Cat B, BI-RADS CAT 1 neg        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   Chlamydia    General counseling for prescription of oral contraceptives 01/26/2013   Headache(784.0)    Menorrhagia 01/05/2013   STD (sexually transmitted disease)     Past Surgical History:  Procedure Laterality Date   BREAST BIOPSY Left    carpel tunnel release     NASAL SINUS SURGERY N/A 01/30/2014   Procedure: IRRIGATION AND DEBRIDEMENT NASAL SEPTAL ABSCESS;  Surgeon: Flo Shanks, MD;  Location: [facility] OR;  Service: ENT;  Laterality: N/A;   TONSILLECTOMY     TUBAL LIGATION      Current Outpatient Medications  Medication Sig Dispense Refill   ibuprofen (ADVIL) 800 MG tablet Take 1 tablet (800 mg total) by mouth every 8 (eight) hours as needed. 30 tablet 1   metFORMIN (GLUCOPHAGE-XR) 500 MG 24 hr tablet Take 1 tablet (500 mg total) by mouth daily with breakfast. 90 tablet 0   traZODone (DESYREL) 50 MG tablet Take 1 tablet (50 mg total) by mouth at bedtime. 90 tablet 0   No current  facility-administered medications for this visit.     ALLERGIES: Patient has no known allergies.  Family History  Problem Relation Age of Onset   Asthma Mother    Hypertension Mother    Diabetes Maternal Aunt    Hypertension Maternal Grandmother    Diabetes Maternal Grandfather     Social History   Socioeconomic History   Marital status: Married    Spouse name: Not on file   Number of children: Not on file   Years of education: Not on file   Highest education level: Not on file  Occupational History   Not on file  Tobacco Use   Smoking status: Never   Smokeless tobacco: Never  Vaping Use   Vaping status: Never Used  Substance and Sexual Activity   Alcohol use: Not Currently    Comment: Occasionally   Drug use: Yes    Frequency: 5.0 times per week    Types: Marijuana    Comment: For sleep   Sexual activity: Yes    Birth control/protection: None    Comment: last intercourse 3-4 wks ago, first intercourse>17  Other Topics  Concern   Not on file  Social History Narrative   Not on file   Social Drivers of Health   Financial Resource Strain: Patient Declined (09/24/2023)   Overall Financial Resource Strain (CARDIA)    Difficulty of Paying Living Expenses: Patient declined  Food Insecurity: Patient Declined (09/24/2023)   Hunger Vital Sign    Worried About Running Out of Food in the Last Year: Patient declined    Ran Out of Food in the Last Year: Patient declined  Transportation Needs: Patient Declined (09/24/2023)   PRAPARE - Administrator, Civil Service (Medical): Patient declined    Lack of Transportation (Non-Medical): Patient declined  Physical Activity: Unknown (09/24/2023)   Exercise Vital Sign    Days of Exercise per Week: 0 days    Minutes of Exercise per Session: Not on file  Stress: Stress Concern Present (09/24/2023)   [employer] of Occupational Health - Occupational Stress Questionnaire    Feeling of Stress : To some extent  Social  Connections: Unknown (09/24/2023)   Social Connection and Isolation Panel [NHANES]    Frequency of Communication with Friends and Family: More than three times a week    Frequency of Social Gatherings with Friends and Family: Patient declined    Attends Religious Services: Patient declined    Database administrator or Organizations: No    Attends Engineer, structural: Not on file    Marital Status: Married  Catering manager Violence: Not At Risk (02/25/2023)   Received from [facility]   HITS    Over the last 12 months how often did your partner physically hurt you?: Never    Over the last 12 months how often did your partner insult you or talk down to you?: Never    Over the last 12 months how often did your partner threaten you with physical harm?: Never    Over the last 12 months how often did your partner scream or curse at you?: Never    Review of Systems  PHYSICAL EXAMINATION:   LMP 09/13/2023     General appearance: alert, cooperative and appears stated age Head: Normocephalic, without obvious abnormality, atraumatic Neck: no adenopathy, supple, symmetrical, trachea midline and thyroid normal to inspection and palpation Lungs: clear to auscultation bilaterally Breasts: normal appearance, no masses or tenderness, No nipple retraction or dimpling, No nipple discharge or bleeding, No axillary or supraclavicular adenopathy Heart: regular rate and rhythm Abdomen: soft, non-tender, no masses,  no organomegaly Extremities: extremities normal, atraumatic, no cyanosis or edema Skin: Skin color, texture, turgor normal. No rashes or lesions Lymph nodes: Cervical, supraclavicular, and axillary nodes normal. No abnormal inguinal nodes palpated Neurologic: Grossly normal  Pelvic: External genitalia:  no lesions              Urethra:  normal appearing urethra with no masses, tenderness or lesions              Bartholins and Skenes: normal                 Vagina: normal appearing  vagina with normal color and discharge, no lesions              Cervix: no lesions                Bimanual Exam:  Uterus:  normal size, contour, position, consistency, mobility, non-tender              Adnexa: no mass, fullness, tenderness  Rectal exam: {yes no:314532}.  Confirms.              Anus:  normal sphincter tone, no lesions  Chaperone was present for exam:  {BSCHAPERONE:31226::"Daelan Gatt F, CMA"}  ASSESSMENT:    PLAN:    {LABS (Optional):23779}  ***  total time was spent for this patient encounter, including preparation, face-to-face counseling with the patient, coordination of care, and documentation of the encounter.

## 2023-10-16 ENCOUNTER — Ambulatory Visit: Payer: 59 | Admitting: Obstetrics and Gynecology

## 2023-11-04 ENCOUNTER — Encounter: Payer: Self-pay | Admitting: Obstetrics and Gynecology

## 2023-11-13 ENCOUNTER — Ambulatory Visit: Payer: 59 | Admitting: Family Medicine

## 2023-11-13 ENCOUNTER — Telehealth: Payer: Self-pay | Admitting: Obstetrics and Gynecology

## 2023-11-13 NOTE — Telephone Encounter (Signed)
 Patient no show to her endo biopsy appointment on February 26. Voicemail was left and MyChart message sent and letter mailed for patient to call and reschedule her missed appointment; patient has not rescheduled.

## 2023-11-19 NOTE — Telephone Encounter (Signed)
 Call placed to patient, left detailed message, ok per dpr. Advised I am f/u on recommended EMB. Please return call to [place], [place] at [phone], opt 5.

## 2023-11-21 NOTE — Progress Notes (Deleted)
 48 y.o. [id number] Married [place] American female here for annual exam.    PCP: Suezanne Jacquet, Vickie L, NP-C   No LMP recorded.           Sexually active: {yes no:314532}  The current method of family planning is tubal ligation.    Menopausal hormone therapy:  n/a Exercising: {yes no:314532}  {types:19826} Smoker:  no  OB History  Gravida Para Term Preterm AB Living  4 3 3  0 1 3  SAB IAB Ectopic Multiple Live Births  1 0 0 0 3    # Outcome Date GA Lbr Len/2nd Weight Sex Type Anes PTL Lv  4 SAB           3 Term           2 Term           1 Term      Vag-Spont        HEALTH MAINTENANCE: Last 2 paps:  11/30/21 ASCUS: HR HPV neg, 01/01/14 neg History of abnormal Pap or positive HPV:  yes Mammogram:   09/18/23 Breast Density Cat B, BI-RADS CAT 1 neg Colonoscopy:   Bone Density:  n/a  Result  n/a   Immunization History  Administered Date(s) Administered   PPD Test 03/16/2014   Td (Adult),5 Lf Tetanus Toxid, Preservative Free 04/04/1999   Tdap 04/04/1999, 06/09/2014      reports that she has never smoked. She has never used smokeless tobacco. She reports that she does not currently use alcohol. She reports current drug use. Frequency: 5.00 times per week. Drug: Marijuana.  Past Medical History:  Diagnosis Date   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   Chlamydia    General counseling for prescription of oral contraceptives 01/26/2013   Headache(784.0)    Menorrhagia 01/05/2013   STD (sexually transmitted disease)     Past Surgical History:  Procedure Laterality Date   BREAST BIOPSY Left    carpel tunnel release     NASAL SINUS SURGERY N/A 01/30/2014   Procedure: IRRIGATION AND DEBRIDEMENT NASAL SEPTAL ABSCESS;  Surgeon: Flo Shanks, MD;  Location: [facility] OR;  Service: ENT;  Laterality: N/A;   TONSILLECTOMY     TUBAL LIGATION      Current Outpatient Medications  Medication Sig Dispense Refill   ibuprofen (ADVIL) 800 MG tablet Take 1 tablet (800 mg total) by mouth every 8  (eight) hours as needed. 30 tablet 1   metFORMIN (GLUCOPHAGE-XR) 500 MG 24 hr tablet Take 1 tablet (500 mg total) by mouth daily with breakfast. 90 tablet 0   traZODone (DESYREL) 50 MG tablet Take 1 tablet (50 mg total) by mouth at bedtime. 90 tablet 0   No current facility-administered medications for this visit.    ALLERGIES: Patient has no known allergies.  Family History  Problem Relation Age of Onset   Asthma Mother    Hypertension Mother    Diabetes Maternal Aunt    Hypertension Maternal Grandmother    Diabetes Maternal Grandfather     Review of Systems  PHYSICAL EXAM:  There were no vitals taken for this visit.    General appearance: alert, cooperative and appears stated age Head: normocephalic, without obvious abnormality, atraumatic Neck: no adenopathy, supple, symmetrical, trachea midline and thyroid normal to inspection and palpation Lungs: clear to auscultation bilaterally Breasts: normal appearance, no masses or tenderness, No nipple retraction or dimpling, No nipple discharge or bleeding, No axillary adenopathy Heart: regular rate and rhythm Abdomen: soft, non-tender; no masses, no organomegaly  Extremities: extremities normal, atraumatic, no cyanosis or edema Skin: skin color, texture, turgor normal. No rashes or lesions Lymph nodes: cervical, supraclavicular, and axillary nodes normal. Neurologic: grossly normal  Pelvic: External genitalia:  no lesions              No abnormal inguinal nodes palpated.              Urethra:  normal appearing urethra with no masses, tenderness or lesions              Bartholins and Skenes: normal                 Vagina: normal appearing vagina with normal color and discharge, no lesions              Cervix: no lesions              Pap taken: {yes no:314532} Bimanual Exam:  Uterus:  normal size, contour, position, consistency, mobility, non-tender              Adnexa: no mass, fullness, tenderness              Rectal exam: {yes  no:314532}.  Confirms.              Anus:  normal sphincter tone, no lesions  Chaperone was present for exam:  {BSCHAPERONE:31226::"Averianna Brugger F, CMA"}  ASSESSMENT: Well woman visit with gynecologic exam.  PHQ-9: ***  ***  PLAN: Mammogram screening discussed. Self breast awareness reviewed. Pap and HRV collected:  {yes no:314532} Guidelines for Calcium, Vitamin D, regular exercise program including cardiovascular and weight bearing exercise. Medication refills:  *** {LABS (Optional):23779} Follow up:  ***    Additional counseling given.  {yes [id number]. ***  total time was spent for this patient encounter, including preparation, face-to-face counseling with the patient, coordination of care, and documentation of the encounter in addition to doing the well woman visit with gynecologic exam.

## 2023-12-05 ENCOUNTER — Ambulatory Visit: Payer: 59 | Admitting: Obstetrics and Gynecology

## 2023-12-05 ENCOUNTER — Encounter: Payer: Self-pay | Admitting: Family Medicine

## 2023-12-05 ENCOUNTER — Ambulatory Visit (INDEPENDENT_AMBULATORY_CARE_PROVIDER_SITE_OTHER): Admitting: Family Medicine

## 2023-12-05 VITALS — BP 134/80 | HR 76 | Temp 97.7°F | Ht 66.5 in | Wt 228.0 lb

## 2023-12-05 DIAGNOSIS — G4761 Periodic limb movement disorder: Secondary | ICD-10-CM | POA: Diagnosis not present

## 2023-12-05 DIAGNOSIS — Z6836 Body mass index (BMI) 36.0-36.9, adult: Secondary | ICD-10-CM | POA: Diagnosis not present

## 2023-12-05 DIAGNOSIS — E611 Iron deficiency: Secondary | ICD-10-CM

## 2023-12-05 DIAGNOSIS — G4700 Insomnia, unspecified: Secondary | ICD-10-CM | POA: Diagnosis not present

## 2023-12-05 DIAGNOSIS — E669 Obesity, unspecified: Secondary | ICD-10-CM

## 2023-12-05 DIAGNOSIS — Z1211 Encounter for screening for malignant neoplasm of colon: Secondary | ICD-10-CM

## 2023-12-05 LAB — HEMOGLOBIN A1C: Hgb A1c MFr Bld: 5.6 % (ref 4.6–6.5)

## 2023-12-05 LAB — CBC WITH DIFFERENTIAL/PLATELET
Basophils Absolute: 0 10*3/uL (ref 0.0–0.1)
Basophils Relative: 1 % (ref 0.0–3.0)
Eosinophils Absolute: 0.2 10*3/uL (ref 0.0–0.7)
Eosinophils Relative: 4.8 % (ref 0.0–5.0)
HCT: 39.6 % (ref 36.0–46.0)
Hemoglobin: 12.9 g/dL (ref 12.0–15.0)
Lymphocytes Relative: 40.8 % (ref 12.0–46.0)
Lymphs Abs: 1.7 10*3/uL (ref 0.7–4.0)
MCHC: 32.5 g/dL (ref 30.0–36.0)
MCV: 87.2 fl (ref 78.0–100.0)
Monocytes Absolute: 0.4 10*3/uL (ref 0.1–1.0)
Monocytes Relative: 10.5 % (ref 3.0–12.0)
Neutro Abs: 1.8 10*3/uL (ref 1.4–7.7)
Neutrophils Relative %: 42.9 % — ABNORMAL LOW (ref 43.0–77.0)
Platelets: 336 10*3/uL (ref 150.0–400.0)
RBC: 4.54 Mil/uL (ref 3.87–5.11)
RDW: 14 % (ref 11.5–15.5)
WBC: 4.2 10*3/uL (ref 4.0–10.5)

## 2023-12-05 LAB — FOLATE: Folate: 14.9 ng/mL (ref >5.9)

## 2023-12-05 LAB — LIPID PANEL
Cholesterol: 173 mg/dL (ref 0–200)
HDL: 44.9 mg/dL (ref >39.00)
LDL Cholesterol: 95 mg/dL (ref 0–99)
NonHDL: 127.75
Total CHOL/HDL Ratio: 4
Triglycerides: 166 mg/dL — ABNORMAL HIGH (ref 0.0–149.0)
VLDL: 33.2 mg/dL (ref 0.0–40.0)

## 2023-12-05 LAB — COMPREHENSIVE METABOLIC PANEL WITH GFR
ALT: 13 U/L (ref 0–35)
AST: 13 U/L (ref 0–37)
Albumin: 4.4 g/dL (ref 3.5–5.2)
Alkaline Phosphatase: 53 U/L (ref 39–117)
BUN: 8 mg/dL (ref 6–23)
CO2: 28 meq/L (ref 19–32)
Calcium: 9.4 mg/dL (ref 8.4–10.5)
Chloride: 104 meq/L (ref 96–112)
Creatinine, Ser: 0.98 mg/dL (ref 0.40–1.20)
GFR: 69.15 mL/min (ref >60.00)
Glucose, Bld: 91 mg/dL (ref 70–99)
Potassium: 4 meq/L (ref 3.5–5.1)
Sodium: 138 meq/L (ref 135–145)
Total Bilirubin: 0.5 mg/dL (ref 0.2–1.2)
Total Protein: 7 g/dL (ref 6.0–8.3)

## 2023-12-05 LAB — TSH: TSH: 1.04 u[IU]/mL (ref 0.35–5.50)

## 2023-12-05 LAB — T4, FREE: Free T4: 0.84 ng/dL (ref 0.60–1.60)

## 2023-12-05 LAB — FERRITIN: Ferritin: 5.9 ng/mL — ABNORMAL LOW (ref 10.0–291.0)

## 2023-12-05 LAB — VITAMIN B12: Vitamin B-12: 266 pg/mL (ref 211–911)

## 2023-12-05 MED ORDER — METFORMIN HCL ER 500 MG PO TB24: 500.0000 mg | ORAL_TABLET | Freq: Every day | ORAL | 1 refills | Status: DC

## 2023-12-05 NOTE — Patient Instructions (Signed)
 She is please go downstairs for labs before you leave.  I referred you to [facility], the sleep specialist in that practice and they will call you to schedule a visit.  I also referred you to [facility] for your colon cancer screening  Will be in touch with your results.

## 2023-12-05 NOTE — Progress Notes (Signed)
 Subjective:     Patient ID: Jasmine Gentry, female    DOB: 25-Apr-1976, 48 y.o.   MRN: [id number]  Chief Complaint  Patient presents with   Medical Management of Chronic Issues    6 week f/u    HPI   History of Present Illness         She is here to follow up on chronic health conditions.   Obesity- working on weight loss and started metformin .  GLP-1s are not covered by her insurance.   Taking metformin . Noticed reduced cravings for sweets and carbohydrates. She is exercising more.  Clothes fitting better.   Insomnia- Tried melatonin and Trazodone  and did not notice any improvement.  Nightmares with melatonin  Sleep study done per patient.   Non-restorative sleep.  Loud snoring.  Negative OSA in 2017 per sleep study.  Mild limb movement noted.   She reports increased sensation to move her legs at night and lately during they daytime.    Does not use drugs or drink alcohol for sleep.    Works for [employer]       Health Maintenance Due  Topic Date Due   Colonoscopy  Never done    Past Medical History:  Diagnosis Date   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   Chlamydia    General counseling for prescription of oral contraceptives 01/26/2013   Headache(784.0)    Menorrhagia 01/05/2013   STD (sexually transmitted disease)     Past Surgical History:  Procedure Laterality Date   BREAST BIOPSY Left    carpel tunnel release     NASAL SINUS SURGERY N/A 01/30/2014   Procedure: IRRIGATION AND DEBRIDEMENT NASAL SEPTAL ABSCESS;  Surgeon: Lenton Rail, MD;  Location: [facility] OR;  Service: ENT;  Laterality: N/A;   TONSILLECTOMY     TUBAL LIGATION      Family History  Problem Relation Age of Onset   Asthma Mother    Hypertension Mother    Diabetes Maternal Aunt    Hypertension Maternal Grandmother    Diabetes Maternal Grandfather     Social History   Socioeconomic History   Marital status: Married    Spouse  name: Not on file   Number of children: Not on file   Years of education: Not on file   Highest education level: Not on file  Occupational History   Not on file  Tobacco Use   Smoking status: Never   Smokeless tobacco: Never  Vaping Use   Vaping status: Never Used  Substance and Sexual Activity   Alcohol use: Not Currently    Comment: Occasionally   Drug use: Yes    Frequency: 5.0 times per week    Types: Marijuana    Comment: For sleep   Sexual activity: Yes    Birth control/protection: None    Comment: last intercourse 3-4 wks ago, first intercourse>17  Other Topics Concern   Not on file  Social History Narrative   Not on file   Social Drivers of Health   Financial Resource Strain: Patient Declined (09/24/2023)   Overall Financial Resource Strain (CARDIA)    Difficulty of Paying Living Expenses: Patient declined  Food Insecurity: Patient Declined (09/24/2023)   Hunger Vital Sign    Worried About Running Out of Food in the Last Year: Patient declined    Ran Out of Food in the Last Year: Patient declined  Transportation Needs: Patient Declined (09/24/2023)   PRAPARE - Transportation  Lack of Transportation (Medical): Patient declined    Lack of Transportation (Non-Medical): Patient declined  Physical Activity: Unknown (09/24/2023)   Exercise Vital Sign    Days of Exercise per Week: 0 days    Minutes of Exercise per Session: Not on file  Stress: Stress Concern Present (09/24/2023)   [employer] of Occupational Health - Occupational Stress Questionnaire    Feeling of Stress : To some extent  Social Connections: Unknown (09/24/2023)   Social Connection and Isolation Panel [NHANES]    Frequency of Communication with Friends and Family: More than three times a week    Frequency of Social Gatherings with Friends and Family: Patient declined    Attends Religious Services: Patient declined    Database administrator or Organizations: No    Attends Engineer, structural:  Not on file    Marital Status: Married  Catering manager Violence: Not At Risk (02/25/2023)   Received from [facility]   HITS    Over the last 12 months how often did your partner physically hurt you?: Never    Over the last 12 months how often did your partner insult you or talk down to you?: Never    Over the last 12 months how often did your partner threaten you with physical harm?: Never    Over the last 12 months how often did your partner scream or curse at you?: Never    Outpatient Medications Prior to Visit  Medication Sig Dispense Refill   ibuprofen  (ADVIL ) 800 MG tablet Take 1 tablet (800 mg total) by mouth every 8 (eight) hours as needed. 30 tablet 1   traZODone  (DESYREL ) 50 MG tablet Take 1 tablet (50 mg total) by mouth at bedtime. 90 tablet 0   metFORMIN  (GLUCOPHAGE -XR) 500 MG 24 hr tablet Take 1 tablet (500 mg total) by mouth daily with breakfast. 90 tablet 0   No facility-administered medications prior to visit.    No Known Allergies  Review of Systems  Constitutional:  Positive for malaise/fatigue. Negative for chills and fever.  Eyes:  Negative for blurred vision and double vision.  Respiratory:  Negative for cough and shortness of breath.   Cardiovascular:  Negative for chest pain, palpitations and leg swelling.  Gastrointestinal:  Negative for abdominal pain, constipation, diarrhea, nausea and vomiting.  Genitourinary:  Negative for dysuria, frequency and urgency.  Musculoskeletal:  Negative for falls.  Neurological:  Negative for dizziness, focal weakness and headaches.  Psychiatric/Behavioral:  Negative for depression and substance abuse. The patient has insomnia. The patient is not nervous/anxious.        Objective:    Physical Exam Constitutional:      General: She is not in acute distress.    Appearance: She is not ill-appearing.  Eyes:     Extraocular Movements: Extraocular movements intact.     Conjunctiva/sclera: Conjunctivae normal.   Cardiovascular:     Rate and Rhythm: Normal rate.  Pulmonary:     Effort: Pulmonary effort is normal.  Musculoskeletal:     Cervical back: Normal range of motion and neck supple.  Skin:    General: Skin is warm and dry.  Neurological:     General: No focal deficit present.     Mental Status: She is alert and oriented to person, place, and time.     Motor: No weakness.     Coordination: Coordination normal.     Gait: Gait normal.  Psychiatric:        Mood and  Affect: Mood normal.        Behavior: Behavior normal.        Thought Content: Thought content normal.      BP 134/80 (BP Location: Left Arm, Patient Position: Sitting)   Pulse 76   Temp 97.7 F (36.5 C) (Temporal)   Ht 5' 6.5" (1.689 m)   Wt 228 lb (103.4 kg)   SpO2 100%   BMI 36.25 kg/m  Wt Readings from Last 3 Encounters:  12/05/23 228 lb (103.4 kg)  09/24/23 231 lb (104.8 kg)  09/17/23 231 lb (104.8 kg)       Assessment & Plan:   Problem List Items Addressed This Visit     Insomnia - Primary   Relevant Orders   Ambulatory referral to Neurology   CBC with Differential/Platelet (Completed)   Comprehensive metabolic panel with GFR (Completed)   Ferritin (Completed)   TSH (Completed)   T4, free (Completed)   Iron deficiency   Relevant Orders   CBC with Differential/Platelet (Completed)   Ferritin (Completed)   Vitamin B12 (Completed)   Folate (Completed)   Obesity (BMI 30-39.9)   Relevant Medications   metFORMIN  (GLUCOPHAGE -XR) 500 MG 24 hr tablet   Other Relevant Orders   CBC with Differential/Platelet (Completed)   Comprehensive metabolic panel with GFR (Completed)   Hemoglobin A1c (Completed)   Ferritin (Completed)   Vitamin B12 (Completed)   Folate (Completed)   TSH (Completed)   T4, free (Completed)   Lipid panel (Completed)   Periodic limb movement disorder (PLMD)   Relevant Orders   Ambulatory referral to Neurology   Other Visit Diagnoses       Screen for colon cancer        Relevant Orders   Ambulatory referral to Gastroenterology      Suspicious for sleep disorder. Previous sleep test in 2017 revealed loud snoring and mild limb movement, negative for OSA She notes the sensation that she needs to move her legs more often and worsening insomnia. Trazodone  did not help. Melatonin caused nightmares.  Referral to sleep specialist at [facility].  Continue working on healthy diet and exercise for weight loss. She has lost weight and motivated.  Referral to GI for first screening colonoscopy.     I am having Tilda Fogo "Shoe-Key" maintain her ibuprofen , traZODone , and metFORMIN .  Meds ordered this encounter  Medications   metFORMIN  (GLUCOPHAGE -XR) 500 MG 24 hr tablet    Sig: Take 1 tablet (500 mg total) by mouth daily with breakfast.    Dispense:  90 tablet    Refill:  1    Supervising Provider:   Bambi Lever A [[id number]]

## 2023-12-08 ENCOUNTER — Encounter: Payer: Self-pay | Admitting: Family Medicine

## 2023-12-20 ENCOUNTER — Encounter: Payer: Self-pay | Admitting: Pediatrics

## 2023-12-20 ENCOUNTER — Other Ambulatory Visit: Payer: Self-pay | Admitting: Family Medicine

## 2023-12-31 NOTE — Telephone Encounter (Signed)
 Left message to call Noreene Larsson, RN at [facility], [phone], option 5.

## 2024-01-08 NOTE — Telephone Encounter (Signed)
 Spoke with patient, patient reports no changes in symptoms. Declines to schedule EMB at this time due to work schedule, states she would need to schedule before 8am or after 5pm. Offered to look at options for first appt or last appt of day, patient declined. Advised patient I will provide update to Dr. Colvin Dec and f/u with any additional recommendations. Patient agreeable.   Routing to Dr. Colvin Dec to review and advise.

## 2024-01-08 NOTE — Telephone Encounter (Signed)
 I do recommend she have a pap, high risk HPV testing, and an endometrial biopsy.  She has not completed her evaluation for abnormal bleeding and a mass detected inside her uterine cavity.   The goal is to provide her safe and good care and to make sure she does not have an undiagnosed precancer or cancer of her cervix or uterus.  The ultrasound alone is not adequate to make a diagnosis.  She will need this communication through My Chart and a formal letter.

## 2024-01-08 NOTE — Telephone Encounter (Signed)
 Call returned to patient, Left message to call Aleda Hurl, RN at [facility], [phone], option 5.

## 2024-01-29 NOTE — Telephone Encounter (Signed)
 Spoke with patient, advised per Dr. Colvin Dec. Patient request to schedule on 6/20 or 6/27 with any provider. Reviewed schedule, offered 6/20 with Dr. Tia Flowers, patient declined, has another appt with her spouse. Patient is scheduled for colonoscopy on 6/27, advised she would not be able to schedule this appointment on same day if she is being sedated for colonoscopy.   Patient states she will return call at later date.   Letter pended and routed to Dr. Colvin Dec to review.

## 2024-01-29 NOTE — Telephone Encounter (Signed)
 Patient returned call, spoke with front office, scheduled PAP and EMB for 6/20 at 0930 with Dr. Tia Flowers.   Letter cancelled.   Routing FYI.   Cc: Dr. Tia Flowers

## 2024-01-31 ENCOUNTER — Ambulatory Visit (AMBULATORY_SURGERY_CENTER)

## 2024-01-31 ENCOUNTER — Telehealth: Payer: Self-pay

## 2024-01-31 VITALS — Ht 66.5 in | Wt 225.0 lb

## 2024-01-31 DIAGNOSIS — Z1211 Encounter for screening for malignant neoplasm of colon: Secondary | ICD-10-CM

## 2024-01-31 MED ORDER — NA SULFATE-K SULFATE-MG SULF 17.5-3.13-1.6 GM/177ML PO SOLN
1.0000 | Freq: Once | ORAL | 0 refills | Status: AC
Start: 2024-01-31 — End: 2024-01-31

## 2024-01-31 NOTE — Patient Instructions (Signed)
 ORAL DIABETIC MEDICATION INSTRUCTIONS Metformin  Glipizide Jardiance Glimepiride Farixga Januvia  Rybelsus, Xigduo, Sitagliptin, Synjardy Tradjenta Actos, Alogliptin Invokana  The day before your procedure: 6/26  Do not take your diabetic pill   The day of your procedure: 6/27  Do not take your diabetic pill   We will check your blood sugar levels during the admission process and again in Recovery before discharging you home

## 2024-01-31 NOTE — Telephone Encounter (Signed)
 PV in progress

## 2024-01-31 NOTE — Progress Notes (Signed)

## 2024-02-07 ENCOUNTER — Ambulatory Visit: Admitting: Obstetrics and Gynecology

## 2024-02-11 NOTE — Progress Notes (Signed)
 [facility] History and Physical   Primary Care Physician:  Lendia Boby CROME, NP-C   Reason for Procedure:  Colorectal cancer screening  Plan:    Screening colonoscopy     HPI: Jasmine Gentry is a 48 y.o. female undergoing screening colonoscopy for colorectal cancer screening.  This is the patient's first colonoscopy.  No family history of colorectal cancer or polyps.  Patient denies rectal bleeding or change in bowel habits at the time of this exam.   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 Authorizing Provider  ibuprofen  (ADVIL ) 800 MG tablet Take 1 tablet (800 mg total) by mouth every 8 (eight) hours as needed. 09/04/23   Cathlyn JAYSON Nikki Bobie FORBES, MD  metFORMIN  (GLUCOPHAGE -XR) 500 MG 24 hr tablet Take 1 tablet (500 mg total) by mouth daily with breakfast. 12/05/23   Henson, Vickie L, NP-C  traZODone  (DESYREL ) 50 MG tablet TAKE 1 TABLET BY MOUTH EVERYDAY AT BEDTIME Patient not taking: Reported on 01/31/2024 12/20/23   Lendia Boby CROME, NP-C    Current Outpatient Medications  Medication Sig Dispense Refill   ibuprofen  (ADVIL ) 800 MG tablet Take 1 tablet (800 mg total) by mouth every 8 (eight) hours as needed. 30 tablet 1   metFORMIN  (GLUCOPHAGE -XR) 500 MG 24 hr tablet Take 1 tablet (500 mg total) by mouth daily with breakfast. 90 tablet 1   traZODone  (DESYREL ) 50 MG  tablet TAKE 1 TABLET BY MOUTH EVERYDAY AT BEDTIME (Patient not taking: Reported on 01/31/2024) 90 tablet 0   Current Facility-Administered Medications  Medication Dose Route Frequency Provider Last Rate Last Admin   0.9 %  sodium chloride  infusion  500 mL Intravenous Once Suzann Inocente HERO, MD        Allergies as of 02/14/2024   (No Known Allergies)    Family History  Problem Relation Age of Onset   Asthma Mother    Hypertension Mother    Diabetes Maternal Aunt    Hypertension Maternal Grandmother    Diabetes Maternal Grandfather    Colon cancer Neg Hx    Rectal cancer Neg Hx    Stomach cancer Neg Hx     Social History   Socioeconomic History   Marital status: Married    Spouse name: Not on file   Number of children: Not on file   Years of education: Not on file   Highest education level: Not on file  Occupational History   Not on file  Tobacco Use   Smoking status: Never   Smokeless tobacco: Never  Vaping Use   Vaping status: Never Used  Substance and Sexual Activity   Alcohol use: Not Currently    Comment: Occasionally   Drug use: Yes    Frequency: 5.0 times per week    Types: Marijuana    Comment: For sleep  01/31/2024  Sexual activity: Yes    Birth control/protection: None    Comment: last intercourse 3-4 wks ago, first intercourse>17  Other Topics Concern   Not on file  Social History Narrative   Not on file   Social Drivers of Health   Financial Resource Strain: Patient Declined (09/24/2023)   Overall Financial Resource Strain (CARDIA)    Difficulty of Paying Living Expenses: Patient declined  Food Insecurity: Patient Declined (09/24/2023)   Hunger Vital Sign    Worried About Running Out of Food in the Last Year: Patient declined    Ran Out of Food in the Last Year: Patient declined  Transportation Needs: Patient Declined (09/24/2023)   PRAPARE - Administrator, Civil Service (Medical): Patient declined    Lack of Transportation (Non-Medical):  Patient declined  Physical Activity: Unknown (09/24/2023)   Exercise Vital Sign    Days of Exercise per Week: 0 days    Minutes of Exercise per Session: Not on file  Stress: Stress Concern Present (09/24/2023)   [employer] of Occupational Health - Occupational Stress Questionnaire    Feeling of Stress : To some extent  Social Connections: Unknown (09/24/2023)   Social Connection and Isolation Panel    Frequency of Communication with Friends and Family: More than three times a week    Frequency of Social Gatherings with Friends and Family: Patient declined    Attends Religious Services: Patient declined    Database administrator or Organizations: No    Attends Engineer, structural: Not on file    Marital Status: Married  Catering manager Violence: Not At Risk (02/25/2023)   Received from [facility]   HITS    Over the last 12 months how often did your partner physically hurt you?: Never    Over the last 12 months how often did your partner insult you or talk down to you?: Never    Over the last 12 months how often did your partner threaten you with physical harm?: Never    Over the last 12 months how often did your partner scream or curse at you?: Never    Review of Systems:  All other review of systems negative except as mentioned in the HPI.  Physical Exam: Vital signs BP (!) 144/81   Pulse 73   Temp 98.4 F (36.9 C) (Temporal)   Resp 14   Ht 5' 6.5 (1.689 m)   Wt 225 lb (102.1 kg)   LMP 02/05/2024 (Exact Date)   SpO2 100%   BMI 35.77 kg/m   General:   Alert,  Well-developed, well-nourished, pleasant and cooperative in NAD Airway:  Mallampati 3 Lungs:  Clear throughout to auscultation.   Heart:  Regular rate and rhythm; no murmurs, clicks, rubs,  or gallops. Abdomen:  Soft, nontender and nondistended. Normal bowel sounds.   Neuro/Psych:  Normal mood and affect. A and O x 3  Inocente Hausen, MD [facility]

## 2024-02-14 ENCOUNTER — Encounter: Payer: Self-pay | Admitting: Pediatrics

## 2024-02-14 ENCOUNTER — Ambulatory Visit (AMBULATORY_SURGERY_CENTER): Admitting: Pediatrics

## 2024-02-14 VITALS — BP 144/87 | HR 68 | Temp 98.4°F | Resp 16 | Ht 66.5 in | Wt 225.0 lb

## 2024-02-14 DIAGNOSIS — Z1211 Encounter for screening for malignant neoplasm of colon: Secondary | ICD-10-CM

## 2024-02-14 MED ORDER — SODIUM CHLORIDE 0.9 % IV SOLN: 500.0000 mL | Freq: Once | INTRAVENOUS | Status: DC

## 2024-02-14 NOTE — Progress Notes (Signed)
 Vitals-DT  Pt's states no medical or surgical changes since previsit or office visit.

## 2024-02-14 NOTE — Op Note (Signed)
 [facility] Patient Name: Jasmine Gentry Procedure Date: 02/14/2024 10:45 AM MRN: [id number] Endoscopist: Inocente Hausen , MD, [id number] Age: 48 Referring MD:  Date of Birth: 03-17-1976 Gender: Female Account #: [id number] Procedure:                Colonoscopy Indications:              Screening for colorectal malignant neoplasm, This                            is the patient's first colonoscopy Medicines:                Monitored Anesthesia Care Procedure:                Pre-Anesthesia Assessment:                           - Prior to the procedure, a History and Physical                            was performed, and patient medications and                            allergies were reviewed. The patient's tolerance of                            previous anesthesia was also reviewed. The risks                            and benefits of the procedure and the sedation                            options and risks were discussed with the patient.                            All questions were answered, and informed consent                            was obtained. Prior Anticoagulants: The patient has                            taken no anticoagulant or antiplatelet agents. ASA                            Grade Assessment: II - A patient with mild systemic                            disease. After reviewing the risks and benefits,                            the patient was deemed in satisfactory condition to                            undergo the procedure.  After obtaining informed consent, the colonoscope                            was passed under direct vision. Throughout the                            procedure, the patient's blood pressure, pulse, and                            oxygen saturations were monitored continuously. The                            CF [id number] #[id number] was introduced through the anus                            and advanced to the  cecum, identified by                            appendiceal orifice and ileocecal valve. The                            colonoscopy was performed without difficulty. The                            patient tolerated the procedure well. The quality                            of the bowel preparation was good. The ileocecal                            valve, appendiceal orifice, and rectum were                            photographed. Scope In: 10:55:17 AM Scope Out: 11:09:13 AM Scope Withdrawal Time: 0 hours 8 minutes 33 seconds  Total Procedure Duration: 0 hours 13 minutes 56 seconds  Findings:                 The perianal and digital rectal examinations were                            normal. Pertinent negatives include normal                            sphincter tone and no palpable rectal lesions.                           The colon (entire examined portion) appeared normal.                           The retroflexed view of the distal rectum and anal                            verge was normal and showed no anal or rectal  abnormalities. Complications:            No immediate complications. Estimated blood loss:                            None. Estimated Blood Loss:     Estimated blood loss: none. Impression:               - The entire examined colon is normal.                           - The distal rectum and anal verge are normal on                            retroflexion view.                           - No specimens collected. Recommendation:           - Discharge patient to home (ambulatory).                           - Repeat colonoscopy in 10 years for screening                            purposes.                           - The findings and recommendations were discussed                            with the patient's family.                           - Return to referring physician.                           - Patient has a contact number available  for                            emergencies. The signs and symptoms of potential                            delayed complications were discussed with the                            patient. Return to normal activities tomorrow.                            Written discharge instructions were provided to the                            patient. Inocente Hausen, MD 02/14/2024 11:12:09 AM This report has been signed electronically.

## 2024-02-14 NOTE — Patient Instructions (Signed)
REPEAT Colonoscopy in 10 years for screening purposes.  YOU HAD AN ENDOSCOPIC PROCEDURE TODAY AT THE [facility]:   Refer to the procedure report that was given to you for any specific questions about what was found during the examination.  If the procedure report does not answer your questions, please call your gastroenterologist to clarify.  If you requested that your care partner not be given the details of your procedure findings, then the procedure report has been included in a sealed envelope for you to review at your convenience later.  YOU SHOULD EXPECT: Some feelings of bloating in the abdomen. Passage of more gas than usual.  Walking can help get rid of the air that was put into your GI tract during the procedure and reduce the bloating. If you had a lower endoscopy (such as a colonoscopy or flexible sigmoidoscopy) you may notice spotting of blood in your stool or on the toilet paper. If you underwent a bowel prep for your procedure, you may not have a normal bowel movement for a few days.  Please Note:  You might notice some irritation and congestion in your nose or some drainage.  This is from the oxygen used during your procedure.  There is no need for concern and it should clear up in a day or so.  SYMPTOMS TO REPORT IMMEDIATELY:  Following lower endoscopy (colonoscopy or flexible sigmoidoscopy):  Excessive amounts of blood in the stool  Significant tenderness or worsening of abdominal pains  Swelling of the abdomen that is new, acute  Fever of 100F or higher  For urgent or emergent issues, a gastroenterologist can be reached at any hour by calling [phone]. Do not use MyChart messaging for urgent concerns.    DIET:  We do recommend a small meal at first, but then you may proceed to your regular diet.  Drink plenty of fluids but you should avoid alcoholic beverages for 24 hours.  ACTIVITY:  You should plan to take it easy for the rest of today and you should  NOT DRIVE or use heavy machinery until tomorrow (because of the sedation medicines used during the test).    FOLLOW UP: Our staff will call the number listed on your records the next business day following your procedure.  We will call around 7:15- 8:00 am to check on you and address any questions or concerns that you may have regarding the information given to you following your procedure. If we do not reach you, we will leave a message.     If any biopsies were taken you will be contacted by phone or by letter within the next 1-3 weeks.  Please call us at [phone] if you have not heard about the biopsies in 3 weeks.    SIGNATURES/CONFIDENTIALITY: You and/or your care partner have signed paperwork which will be entered into your electronic medical record.  These signatures attest to the fact that that the information above on your After Visit Summary has been reviewed and is understood.  Full responsibility of the confidentiality of this discharge information lies with you and/or your care-partner.

## 2024-02-14 NOTE — Progress Notes (Signed)
 Report to PACU, RN, vss, BBS= Clear.

## 2024-02-17 ENCOUNTER — Telehealth: Payer: Self-pay

## 2024-02-17 NOTE — Telephone Encounter (Signed)
  Follow up Call-     02/14/2024   10:08 AM 02/14/2024   10:00 AM  Call back number  Post procedure Call Back phone  # [phone]   Permission to leave phone message  Yes     Patient questions:  Do you have a fever, pain , or abdominal swelling? No. Pain Score  0 *  Have you tolerated food without any problems? Yes.    Have you been able to return to your normal activities? Yes.    Do you have any questions about your discharge instructions: Diet   No. Medications  No. Follow up visit  No.  Do you have questions or concerns about your Care? Yes.    Actions: * If pain score is 4 or above: No action needed, pain <4.

## 2024-02-26 ENCOUNTER — Ambulatory Visit: Admitting: Family Medicine

## 2024-03-02 ENCOUNTER — Encounter: Payer: Self-pay | Admitting: Family Medicine

## 2024-03-02 NOTE — Progress Notes (Signed)
 "  Acute Office Visit  Subjective:     Patient ID: Jasmine Gentry, female    DOB: 11-Dec-1975, 48 y.o.   MRN: [id number]  Chief Complaint  Patient presents with   Cough    Present since URI in march, worse at night. Has tried Benadryl , salt water, tea with honey, and cough drops. Denies SOB, and chest pains. Occasional nasal drainage    HPI  Discussed the use of AI scribe software for clinical note transcription with the patient, who gave verbal consent to proceed.  History of Present Illness Jasmine Gentry is a 48 year old female who presents with a chronic cough and postnasal drip.  Chronic cough and postnasal drip - Persistent cough and postnasal drip ongoing for an extended period - Cough is severe, occasionally resulting in near vomiting and urinary incontinence - Cough occurs randomly throughout the day and night - Eating triggers coughing episodes - Cough frequently disrupts sleep, causing poor sleep quality - Coughing spells are often accompanied by a runny nose and watery eyes - Nasal drainage and stuffiness follow coughing spells, with stuffiness resolving over time - Mucus is clear - No shortness of breath, fever, or heartburn during coughing spells  Symptom management and medication use - Over-the-counter remedies (Benadryl , salt water gargles, honey, tea, cough drops) provide only temporary relief - No consistent use of allergy medications; Benadryl  used as needed - Previously used an inhaler during a respiratory infection, but not recently  Substance use - No cigarette smoking - Occasional marijuana use to aid sleep     ROS Per HPI      Objective:    BP (!) 142/92 (BP Location: Left Arm, Patient Position: Sitting)   Pulse 82   Temp 98.2 F (36.8 C) (Temporal)   Ht 5' 6.5 (1.689 m)   Wt 229 lb (103.9 kg)   LMP 02/05/2024 (Exact Date)   SpO2 99%   BMI 36.41 kg/m    Physical Exam Vitals and nursing note reviewed.  Constitutional:       General: She is not in acute distress.    Comments: Appears fatigued  HENT:     Head: Normocephalic and atraumatic.     Right Ear: External ear normal.     Left Ear: External ear normal.     Nose: Rhinorrhea present. No congestion.     Right Turbinates: Swollen and pale.     Left Turbinates: Swollen and pale.     Right Sinus: No maxillary sinus tenderness or frontal sinus tenderness.     Left Sinus: No maxillary sinus tenderness or frontal sinus tenderness.     Mouth/Throat:     Mouth: Mucous membranes are moist.     Pharynx: Oropharynx is clear. No oropharyngeal exudate or posterior oropharyngeal erythema.     Comments: Oropharyngeal cobblestoning   Eyes:     Extraocular Movements: Extraocular movements intact.  Cardiovascular:     Rate and Rhythm: Normal rate and regular rhythm.     Heart sounds: Normal heart sounds.  Pulmonary:     Effort: Pulmonary effort is normal. No respiratory distress.     Breath sounds: No wheezing, rhonchi or rales.  Musculoskeletal:     Cervical back: Normal range of motion and neck supple.  Lymphadenopathy:     Cervical: No cervical adenopathy.  Skin:    General: Skin is warm and dry.  Neurological:     General: No focal deficit present.     Mental Status: She is  alert and oriented to person, place, and time.     No results found for any visits on 03/03/24.      Assessment & Plan:   Assessment and Plan Assessment & Plan Chronic Cough Chronic cough with postnasal drip, nocturnal worsening, and possible reactive airway or bronchospasm. Marijuana use noted. ENT evaluation if initial treatments fail. - Prescribe Zyrtec  daily for two weeks. - Prescribe Flonase  nasal spray for two weeks as needed. - Order chest X-ray to rule out pulmonary issues. - Prescribe inhaler for use during coughing fits. - Consider ENT referral if chest X-ray is normal and symptoms persist. - Order labs to rule out infection or other underlying issues.  Sleep  Disturbance Sleep disturbance due to chronic cough. Previous medications ineffective.  Elevated BP Slightly elevated blood pressure with morning headaches. Possible hypertension.  Follow-up Follow-up needed to assess chest X-ray and lab results. - Review chest X-ray and lab results once available. - Consider further evaluation or referral based on test results.      Meds ordered this encounter  Medications   albuterol  (VENTOLIN  HFA) 108 (90 Base) MCG/ACT inhaler    Sig: Inhale 2 puffs into the lungs every 4 (four) hours as needed for wheezing or shortness of breath.    Dispense:  18 g    Refill:  1   cetirizine  (ZYRTEC ) 10 MG tablet    Sig: Take 1 tablet (10 mg total) by mouth daily.    Dispense:  30 tablet    Refill:  11   fluticasone  (FLONASE ) 50 MCG/ACT nasal spray    Sig: Place 2 sprays into both nostrils daily.    Dispense:  16 g    Refill:  6    Return if symptoms worsen or fail to improve.  Jasmine LITTIE Ku, FNP  "

## 2024-03-03 ENCOUNTER — Ambulatory Visit: Payer: Self-pay | Admitting: Family Medicine

## 2024-03-03 ENCOUNTER — Ambulatory Visit (INDEPENDENT_AMBULATORY_CARE_PROVIDER_SITE_OTHER)

## 2024-03-03 ENCOUNTER — Ambulatory Visit (INDEPENDENT_AMBULATORY_CARE_PROVIDER_SITE_OTHER): Admitting: Family Medicine

## 2024-03-03 ENCOUNTER — Encounter: Payer: Self-pay | Admitting: Family Medicine

## 2024-03-03 VITALS — BP 142/92 | HR 82 | Temp 98.2°F | Ht 66.5 in | Wt 229.0 lb

## 2024-03-03 DIAGNOSIS — R030 Elevated blood-pressure reading, without diagnosis of hypertension: Secondary | ICD-10-CM | POA: Diagnosis not present

## 2024-03-03 DIAGNOSIS — R053 Chronic cough: Secondary | ICD-10-CM

## 2024-03-03 DIAGNOSIS — D709 Neutropenia, unspecified: Secondary | ICD-10-CM

## 2024-03-03 LAB — CBC WITH DIFFERENTIAL/PLATELET
Basophils Absolute: 0 K/uL (ref 0.0–0.1)
Basophils Relative: 1.3 % (ref 0.0–3.0)
Eosinophils Absolute: 0.2 K/uL (ref 0.0–0.7)
Eosinophils Relative: 5 % (ref 0.0–5.0)
HCT: 38.8 % (ref 36.0–46.0)
Hemoglobin: 12.6 g/dL (ref 12.0–15.0)
Lymphocytes Relative: 44.3 % (ref 12.0–46.0)
Lymphs Abs: 1.7 K/uL (ref 0.7–4.0)
MCHC: 32.5 g/dL (ref 30.0–36.0)
MCV: 85.3 fl (ref 78.0–100.0)
Monocytes Absolute: 0.3 K/uL (ref 0.1–1.0)
Monocytes Relative: 8.8 % (ref 3.0–12.0)
Neutro Abs: 1.5 K/uL (ref 1.4–7.7)
Neutrophils Relative %: 40.6 % — ABNORMAL LOW (ref 43.0–77.0)
Platelets: 338 K/uL (ref 150.0–400.0)
RBC: 4.55 Mil/uL (ref 3.87–5.11)
RDW: 14.6 % (ref 11.5–15.5)
WBC: 3.7 K/uL — ABNORMAL LOW (ref 4.0–10.5)

## 2024-03-03 LAB — COMPREHENSIVE METABOLIC PANEL WITH GFR
ALT: 11 U/L (ref 0–35)
AST: 15 U/L (ref 0–37)
Albumin: 4.3 g/dL (ref 3.5–5.2)
Alkaline Phosphatase: 51 U/L (ref 39–117)
BUN: 12 mg/dL (ref 6–23)
CO2: 30 meq/L (ref 19–32)
Calcium: 9.4 mg/dL (ref 8.4–10.5)
Chloride: 103 meq/L (ref 96–112)
Creatinine, Ser: 1.01 mg/dL (ref 0.40–1.20)
GFR: 66.57 mL/min (ref >60.00)
Glucose, Bld: 96 mg/dL (ref 70–99)
Potassium: 3.8 meq/L (ref 3.5–5.1)
Sodium: 138 meq/L (ref 135–145)
Total Bilirubin: 0.5 mg/dL (ref 0.2–1.2)
Total Protein: 7.2 g/dL (ref 6.0–8.3)

## 2024-03-03 MED ORDER — ALBUTEROL SULFATE HFA 108 (90 BASE) MCG/ACT IN AERS: 2.0000 | INHALATION_SPRAY | RESPIRATORY_TRACT | 1 refills | Status: DC | PRN

## 2024-03-03 MED ORDER — CETIRIZINE HCL 10 MG PO TABS: 10.0000 mg | ORAL_TABLET | Freq: Every day | ORAL | 11 refills | Status: DC

## 2024-03-03 MED ORDER — FLUTICASONE PROPIONATE 50 MCG/ACT NA SUSP: 2.0000 | Freq: Every day | NASAL | 6 refills | Status: DC

## 2024-03-03 NOTE — Patient Instructions (Addendum)
 May try zyrtec  and nasal spray for the next 2 weeks.  May try albuterol  2 puffs as needed every 4 hours.  We are getting an xray today. We will be in contact with any abnormal results that require further attention.  Follow-up with me for new or worsening symptoms.

## 2024-03-05 DIAGNOSIS — D709 Neutropenia, unspecified: Secondary | ICD-10-CM | POA: Insufficient documentation

## 2024-04-10 ENCOUNTER — Ambulatory Visit: Admitting: Family Medicine

## 2024-09-01 ENCOUNTER — Ambulatory Visit: Admitting: Family Medicine

## 2024-09-01 ENCOUNTER — Ambulatory Visit

## 2024-09-01 VITALS — BP 132/82 | HR 77 | Temp 97.8°F | Ht 66.5 in | Wt 229.0 lb

## 2024-09-01 DIAGNOSIS — M25511 Pain in right shoulder: Secondary | ICD-10-CM

## 2024-09-01 DIAGNOSIS — M25611 Stiffness of right shoulder, not elsewhere classified: Secondary | ICD-10-CM

## 2024-09-01 MED ORDER — MELOXICAM 15 MG PO TABS: 15.0000 mg | ORAL_TABLET | Freq: Every day | ORAL | 0 refills | Status: AC

## 2024-09-01 NOTE — Patient Instructions (Addendum)
 Please go downstairs for an X ray before you leave.   Take the meloxicam  daily for the next 2 weeks. You can add Tylenol  if needed. Avoid over the counter anti-inflammatory pain medication such as ibuprofen , Aleve , Advil , etc.  Continue with ice and heat and topical pain medicine as needed.   I will be in touch with your x-ray results.  I also referred you to [facility].  They are located on the first floor of this building.

## 2024-09-01 NOTE — Progress Notes (Signed)
 "  Subjective:     Patient ID: Jasmine Gentry, female    DOB: 1975/10/16, 49 y.o.   MRN: [id number]  Chief Complaint  Patient presents with   Shoulder Pain    Right shoulder pain x2-3 months. Tried pain patches, heat, special pillow to relieve stress on shoulder. No relief    HPI  Discussed the use of AI scribe software for clinical note transcription with the patient, who gave verbal consent to proceed.  History of Present Illness Jasmine Gentry is a 49 year old female who presents with right shoulder pain.  Right shoulder pain - Duration of 2 to 3 months with gradual worsening - Sharp pain localized to the lateral shoulder - Pain radiates down the arm at times - Exacerbated by raising the arm, putting on a coat, lifting grocery bags, and turning in bed - Worse at night, especially when lying back - Interferes with sleep - Partial relief with ibuprofen  675-900 mg twice daily, ice, heat, and use of a pillow to prop the arm - No history of trauma - No numbness, tingling, or neck pain     Health Maintenance Due  Topic Date Due   COVID-19 Vaccine (1) Never done   Hepatitis B Vaccines 19-59 Average Risk (1 of 3 - 19+ 3-dose series) Never done   Influenza Vaccine  Never done   DTaP/Tdap/Td (4 - Td or Tdap) 06/09/2024    Past Medical History:  Diagnosis Date   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   Chlamydia    General counseling for prescription of oral contraceptives 01/26/2013   Headache(784.0)    Menorrhagia 01/05/2013   STD (sexually transmitted disease)     Past Surgical History:  Procedure Laterality Date   BREAST BIOPSY Left    carpel tunnel release     NASAL SINUS SURGERY N/A 01/30/2014   Procedure: IRRIGATION AND DEBRIDEMENT NASAL SEPTAL ABSCESS;  Surgeon: Marlyce Finer, MD;  Location: [facility] OR;  Service: ENT;  Laterality: N/A;   TONSILLECTOMY     TUBAL LIGATION      Family History  Problem Relation Age of Onset   Asthma Mother    Hypertension  Mother    Diabetes Maternal Aunt    Hypertension Maternal Grandmother    Diabetes Maternal Grandfather    Colon cancer Neg Hx    Rectal cancer Neg Hx    Stomach cancer Neg Hx     Social History   Socioeconomic History   Marital status: Married    Spouse name: Not on file   Number of children: Not on file   Years of education: Not on file   Highest education level: Master's degree (e.g., MA, MS, MEng, MEd, MSW, MBA)  Occupational History   Not on file  Tobacco Use   Smoking status: Never   Smokeless tobacco: Never  Vaping Use   Vaping status: Never Used  Substance and Sexual Activity   Alcohol use: Not Currently    Comment: Occasionally   Drug use: Yes    Frequency: 5.0 times per week    Types: Marijuana    Comment: For sleep  01/31/2024   Sexual activity: Yes    Birth control/protection: None    Comment: last intercourse 3-4 wks ago, first intercourse>17  Other Topics Concern   Not on file  Social History Narrative   Not on file   Social Drivers of Health   Tobacco Use: Low Risk (09/01/2024)   Patient History    Smoking Tobacco  Use: Never    Smokeless Tobacco Use: Never    Passive Exposure: Not on file  Financial Resource Strain: Patient Declined (09/01/2024)   Overall Financial Resource Strain (CARDIA)    Difficulty of Paying Living Expenses: Patient declined  Food Insecurity: Patient Declined (09/01/2024)   Epic    Worried About Programme Researcher, Broadcasting/film/video in the Last Year: Patient declined    Barista in the Last Year: Patient declined  Transportation Needs: No Transportation Needs (09/01/2024)   Epic    Lack of Transportation (Medical): No    Lack of Transportation (Non-Medical): No  Physical Activity: Insufficiently Active (09/01/2024)   Exercise Vital Sign    Days of Exercise per Week: 2 days    Minutes of Exercise per Session: 30 min  Stress: Stress Concern Present (09/01/2024)   [employer] of Occupational Health - Occupational Stress Questionnaire     Feeling of Stress: To some extent  Social Connections: Moderately Isolated (09/01/2024)   Social Connection and Isolation Panel    Frequency of Communication with Friends and Family: More than three times a week    Frequency of Social Gatherings with Friends and Family: More than three times a week    Attends Religious Services: Patient declined    Database Administrator or Organizations: No    Attends Engineer, Structural: Not on file    Marital Status: Married  Catering Manager Violence: Not At Risk (02/25/2023)   Received from [facility]   HITS    Over the last 12 months how often did your partner physically hurt you?: Never    Over the last 12 months how often did your partner insult you or talk down to you?: Never    Over the last 12 months how often did your partner threaten you with physical harm?: Never    Over the last 12 months how often did your partner scream or curse at you?: Never  Depression (PHQ2-9): Low Risk (12/05/2023)   Depression (PHQ2-9)    PHQ-2 Score: 0  Alcohol Screen: Low Risk (09/01/2024)   Alcohol Screen    Last Alcohol Screening Score (AUDIT): 1  Housing: Patient Declined (09/01/2024)   Epic    Unable to Pay for Housing in the Last Year: Patient declined    Number of Times Moved in the Last Year: Not on file    Homeless in the Last Year: Patient declined  Utilities: Not on file  Health Literacy: Not on file    Outpatient Medications Prior to Visit  Medication Sig Dispense Refill   albuterol  (VENTOLIN  HFA) 108 (90 Base) MCG/ACT inhaler Inhale 2 puffs into the lungs every 4 (four) hours as needed for wheezing or shortness of breath. (Patient not taking: Reported on 09/01/2024) 18 g 1   cetirizine  (ZYRTEC ) 10 MG tablet Take 1 tablet (10 mg total) by mouth daily. (Patient not taking: Reported on 09/01/2024) 30 tablet 11   fluticasone  (FLONASE ) 50 MCG/ACT nasal spray Place 2 sprays into both nostrils daily. (Patient not taking: Reported on 09/01/2024)  16 g 6   ibuprofen  (ADVIL ) 800 MG tablet Take 1 tablet (800 mg total) by mouth every 8 (eight) hours as needed. (Patient not taking: Reported on 09/01/2024) 30 tablet 1   metFORMIN  (GLUCOPHAGE -XR) 500 MG 24 hr tablet Take 1 tablet (500 mg total) by mouth daily with breakfast. (Patient not taking: Reported on 09/01/2024) 90 tablet 1   traZODone  (DESYREL ) 50 MG tablet TAKE 1 TABLET BY MOUTH EVERYDAY  AT BEDTIME (Patient not taking: Reported on 09/01/2024) 90 tablet 0   No facility-administered medications prior to visit.    Allergies[1]  Review of Systems  Constitutional:  Negative for chills and fever.  Respiratory:  Negative for shortness of breath.   Cardiovascular:  Negative for chest pain, palpitations and leg swelling.  Gastrointestinal:  Negative for abdominal pain, constipation, diarrhea, nausea and vomiting.  Musculoskeletal:  Positive for joint pain. Negative for back pain and neck pain.  Neurological:  Negative for dizziness, focal weakness and headaches.       Objective:    Physical Exam Constitutional:      General: She is not in acute distress.    Appearance: She is not ill-appearing.  HENT:     Mouth/Throat:     Mouth: Mucous membranes are moist.     Pharynx: Oropharynx is clear.  Eyes:     Extraocular Movements: Extraocular movements intact.     Conjunctiva/sclera: Conjunctivae normal.  Cardiovascular:     Rate and Rhythm: Normal rate.  Pulmonary:     Effort: Pulmonary effort is normal.  Musculoskeletal:     Right shoulder: Tenderness present. Normal range of motion. Normal strength. Normal pulse.     Right hand: Normal strength. Normal sensation. Normal capillary refill. Normal pulse.     Cervical back: Normal range of motion and neck supple.     Comments: TTP of shoulder joint predominantly lateral and posterior. Pain with external rotation and extreme extension   Skin:    General: Skin is warm and dry.  Neurological:     General: No focal deficit present.      Mental Status: She is alert and oriented to person, place, and time.     Cranial Nerves: No cranial nerve deficit.     Sensory: No sensory deficit.     Motor: No weakness.     Coordination: Coordination normal.     Gait: Gait normal.  Psychiatric:        Mood and Affect: Mood normal.        Behavior: Behavior normal.        Thought Content: Thought content normal.      BP 132/82   Pulse 77   Temp 97.8 F (36.6 C) (Temporal)   Ht 5' 6.5 (1.689 m)   Wt 229 lb (103.9 kg)   SpO2 98%   BMI 36.41 kg/m  Wt Readings from Last 3 Encounters:  09/01/24 229 lb (103.9 kg)  03/03/24 229 lb (103.9 kg)  02/14/24 225 lb (102.1 kg)       Assessment & Plan:   Problem List Items Addressed This Visit   None Visit Diagnoses       Acute pain of right shoulder    -  Primary   Relevant Orders   Ambulatory referral to Sports Medicine   DG Shoulder Right     Impaired range of motion of right shoulder       Relevant Orders   Ambulatory referral to Sports Medicine   DG Shoulder Right       Assessment and Plan Assessment & Plan Right shoulder pain and impaired range of motion Chronic right shoulder pain for 2-3 months, gradually worsening, with sharp pain in the lateral part of the shoulder, especially at night. No history of injury. Pain exacerbated by certain movements and positions, such as laying back or lifting objects. No numbness or tingling reported. Physical examination reveals tenderness and pain with certain movements, no weakness.  -  Ordered x-ray of the right shoulder joint. - Prescribed meloxicam  for anti-inflammatory effect. - Advised against taking over-the-counter NSAIDs like ibuprofen  or Aleve . - Recommended Tylenol  for additional pain relief if needed. - Referred to sports medicine for further evaluation and treatment.  - Continue using heat or ice as needed. - Allow use of topical treatments if desired.     I have discontinued Jasmine Gentry's  ibuprofen , metFORMIN , traZODone , albuterol , cetirizine , and fluticasone . I am also having her start on meloxicam .  Meds ordered this encounter  Medications   meloxicam  (MOBIC ) 15 MG tablet    Sig: Take 1 tablet (15 mg total) by mouth daily.    Dispense:  30 tablet    Refill:  0    Supervising Provider:   ROLLENE NORRIS A [[id number]]       [1] No Known Allergies  "

## 2024-09-03 ENCOUNTER — Ambulatory Visit: Admitting: Family Medicine

## 2024-09-07 ENCOUNTER — Telehealth: Payer: Self-pay | Admitting: *Deleted

## 2024-09-07 NOTE — Telephone Encounter (Signed)
 Letter pended and routed to Dr. Nikki.   Current EMB discontinued.

## 2024-09-07 NOTE — Telephone Encounter (Signed)
-----   Message from Bobie Cathlyn JAYSON Nikki, MD sent at 09/01/2024  6:29 PM EST ----- Please send a letter recommending an annual exam with pap and HR HPV testing and re-evaluation of her abnormal bleeding.   Her pap in 2023 was ASCUS, neg HR HPV.  Endometrial biopsy was recommended due to her abnormal uterine bleeding and an abnormal uterine lining noted on her office pelvic ultrasound.   Reference this in the letter.   Barriers to care will also need to be addressed.  Thank you.  Bobie Nikki, MD ----- Message ----- From: Brutus Kate SAILOR, RN Sent: 09/01/2024   3:22 PM EST To: Bobie FORBES Cathlyn JAYSON Nikki, MD; Will Barnacle  Dr. Nikki -patient has not been seen since 09/17/23. No future AEX scheduled.   Please advise.   [facility] ----- Message ----- From: Barnacle Will Sent: 08/31/2024   3:49 PM EST To: [facility] Triage  On Jan 28,2025 Dr Nikki placed order for an endo bx to be scheduled. Patient cancelled her appointment.  Please advise on how to proceed. Thx

## 2024-09-08 ENCOUNTER — Ambulatory Visit: Payer: Self-pay | Admitting: Family Medicine

## 2024-09-08 NOTE — Telephone Encounter (Signed)
 Please have letter printed and I will sign it.  Thank you.

## 2024-09-10 NOTE — Telephone Encounter (Signed)
 Letter printed to be signed and mailed.  Also sent via MyChart.   Routing FYI.   Encounter closed.

## 2024-09-10 NOTE — Progress Notes (Unsigned)
 "               Odis Mace D.CLEMENTEEN [facility] [address] [place] [address] Phone: [phone]   Assessment and Plan:     1. Chronic right shoulder pain (Primary) -Chronic with exacerbation, initial sports medicine visit - Right shoulder pain x 3 to 4 months without specific MOI.  Most consistent with subacromial bursitis versus rotator cuff strain - Start HEP for rotator cuff - Reviewed x-ray with patient at today's visit.  My interpretation: No acute fracture or dislocation. - No significant relief with ibuprofen , meloxicam , and patient had side effect of constipation with meloxicam , so do not recommend continuing p.o. NSAID course. - Patient elected for subacromial CSI.  Tolerated well per note below  Procedure: Subacromial Injection Side: Right  Risks explained and consent was given verbally. The site was cleaned with alcohol prep. A steroid injection was performed from posterior approach using 2mL of 1% lidocaine  without epinephrine  and 1mL of kenalog 40mg /ml. This was well tolerated.  Needle was removed, hemostasis achieved, and post injection instructions were explained.   Pt was advised to call or return to clinic if these symptoms worsen or fail to improve as anticipated.    15 additional minutes spent for educating Therapeutic Home Exercise Program.  This included exercises focusing on stretching, strengthening, with focus on eccentric aspects.   Long term goals include an improvement in range of motion, strength, endurance as well as avoiding reinjury. Patient's frequency would include in 1-2 times a day, 3-5 times a week for a duration of 6-12 weeks. Proper technique shown and discussed handout in great detail with ATC.  All questions were discussed and answered.    Pertinent previous records reviewed include right shoulder x-ray 09/01/2024, primary care note 09/01/2024   Follow Up: 4 to 6 weeks for reevaluation.  If no significant improvement or  worsening of symptoms, could consider physical therapy versus MRI versus ultrasound   Subjective:   I, Jayah Balthazar, am serving as a neurosurgeon for Doctor Morene Mace  Chief Complaint: right shoulder pain   HPI:   09/11/2024 Patient is a 49 year old female with right shoulder pain. Patient states pain started a few months ago 3-4 months. No MOI. Decreased ROM. Shooting pain that goes up and down the arm. She has a spot where she is TTP. Ibu, ice, KT tape, new pillow didn't help much. No numbness or tingling.     Relevant Historical Information: None pertinent  Additional pertinent review of systems negative.  Current Medications[1]   Objective:     Vitals:   09/11/24 0823  BP: 124/78  Pulse: 87  SpO2: 99%  Weight: 223 lb (101.2 kg)  Height: 5' 6 (1.676 m)      Body mass index is 35.99 kg/m.    Physical Exam:    Gen: Appears well, nad, nontoxic and pleasant Neuro:sensation intact, strength is 5/5, muscle tone wnl Skin: no suspicious lesion or defmority Psych: A&O, appropriate mood and affect  Right shoulder:  No deformity, swelling or muscle wasting No scapular winging FF 160, abd 120, int 10, ext 80 NTTP over the [place], clavicle, ac, coracoid, biceps groove, humerus, deltoid, trapezius, cervical spine Positive neer, hawkins, obriens, crossarm, Equivocal empty can,  Negative subscap liftoff, speeds Neg ant drawer, sulcus sign, apprehension Negative Spurling's test bilat FROM of neck    Electronically signed by:  Odis Mace D.[facility] 8:50 AM 09/11/24     [  1]  Current Outpatient Medications:    meloxicam  (MOBIC ) 15 MG tablet, Take 1 tablet (15 mg total) by mouth daily., Disp: 30 tablet, Rfl: 0  "

## 2024-09-11 ENCOUNTER — Encounter: Admitting: Family Medicine

## 2024-09-11 ENCOUNTER — Ambulatory Visit: Admitting: Sports Medicine

## 2024-09-11 VITALS — BP 124/78 | HR 87 | Ht 66.0 in | Wt 223.0 lb

## 2024-09-11 DIAGNOSIS — G8929 Other chronic pain: Secondary | ICD-10-CM

## 2024-09-11 DIAGNOSIS — M25511 Pain in right shoulder: Secondary | ICD-10-CM

## 2024-09-11 NOTE — Patient Instructions (Signed)
 Shoulder HEP  4-6 week follow up

## 2024-10-23 ENCOUNTER — Ambulatory Visit: Admitting: Sports Medicine
